# Patient Record
Sex: Female | Born: 1955 | Race: Black or African American | Hispanic: No | Marital: Single | Smoking: Current every day smoker
Health system: Southern US, Community
[De-identification: ages and names within clinical notes are randomized; demographics above are authoritative.]

## PROBLEM LIST (undated history)

## (undated) DIAGNOSIS — F419 Anxiety disorder, unspecified: Secondary | ICD-10-CM

## (undated) DIAGNOSIS — F32A Depression, unspecified: Secondary | ICD-10-CM

## (undated) DIAGNOSIS — B20 Human immunodeficiency virus [HIV] disease: Secondary | ICD-10-CM

## (undated) DIAGNOSIS — K8020 Calculus of gallbladder without cholecystitis without obstruction: Secondary | ICD-10-CM

## (undated) DIAGNOSIS — M5430 Sciatica, unspecified side: Secondary | ICD-10-CM

## (undated) DIAGNOSIS — F329 Major depressive disorder, single episode, unspecified: Secondary | ICD-10-CM

## (undated) DIAGNOSIS — F17200 Nicotine dependence, unspecified, uncomplicated: Secondary | ICD-10-CM

## (undated) DIAGNOSIS — Z5189 Encounter for other specified aftercare: Secondary | ICD-10-CM

## (undated) DIAGNOSIS — J45909 Unspecified asthma, uncomplicated: Secondary | ICD-10-CM

## (undated) DIAGNOSIS — Z21 Asymptomatic human immunodeficiency virus [HIV] infection status: Secondary | ICD-10-CM

## (undated) HISTORY — DX: Calculus of gallbladder without cholecystitis without obstruction: K80.20

## (undated) HISTORY — DX: Major depressive disorder, single episode, unspecified: F32.9

## (undated) HISTORY — DX: Nicotine dependence, unspecified, uncomplicated: F17.200

## (undated) HISTORY — PX: OTHER SURGICAL HISTORY: SHX169

## (undated) HISTORY — DX: Anxiety disorder, unspecified: F41.9

## (undated) HISTORY — DX: Encounter for other specified aftercare: Z51.89

## (undated) HISTORY — DX: Depression, unspecified: F32.A

---
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
## 2013-02-27 ENCOUNTER — Emergency Department (HOSPITAL_COMMUNITY)
Admission: EM | Admit: 2013-02-27 | Discharge: 2013-02-27 | Disposition: A | Discharge status: Home / Self Care | Payer: Medicaid Other | Attending: Emergency Medicine | Admitting: Emergency Medicine

## 2013-02-27 ENCOUNTER — Encounter (HOSPITAL_COMMUNITY): Payer: Self-pay | Admitting: *Deleted

## 2013-02-27 ENCOUNTER — Emergency Department (HOSPITAL_COMMUNITY): Payer: Medicaid Other

## 2013-02-27 DIAGNOSIS — M549 Dorsalgia, unspecified: Secondary | ICD-10-CM | POA: Insufficient documentation

## 2013-02-27 DIAGNOSIS — R509 Fever, unspecified: Secondary | ICD-10-CM | POA: Insufficient documentation

## 2013-02-27 DIAGNOSIS — J45909 Unspecified asthma, uncomplicated: Secondary | ICD-10-CM | POA: Insufficient documentation

## 2013-02-27 DIAGNOSIS — Z21 Asymptomatic human immunodeficiency virus [HIV] infection status: Secondary | ICD-10-CM | POA: Insufficient documentation

## 2013-02-27 DIAGNOSIS — Z79899 Other long term (current) drug therapy: Secondary | ICD-10-CM | POA: Insufficient documentation

## 2013-02-27 DIAGNOSIS — F17200 Nicotine dependence, unspecified, uncomplicated: Secondary | ICD-10-CM | POA: Insufficient documentation

## 2013-02-27 DIAGNOSIS — Z880 Allergy status to penicillin: Secondary | ICD-10-CM | POA: Insufficient documentation

## 2013-02-27 DIAGNOSIS — Z3202 Encounter for pregnancy test, result negative: Secondary | ICD-10-CM | POA: Insufficient documentation

## 2013-02-27 DIAGNOSIS — K8020 Calculus of gallbladder without cholecystitis without obstruction: Secondary | ICD-10-CM | POA: Insufficient documentation

## 2013-02-27 DIAGNOSIS — Z8669 Personal history of other diseases of the nervous system and sense organs: Secondary | ICD-10-CM | POA: Insufficient documentation

## 2013-02-27 DIAGNOSIS — R112 Nausea with vomiting, unspecified: Secondary | ICD-10-CM | POA: Insufficient documentation

## 2013-02-27 DIAGNOSIS — R5381 Other malaise: Secondary | ICD-10-CM | POA: Insufficient documentation

## 2013-02-27 HISTORY — DX: Unspecified asthma, uncomplicated: J45.909

## 2013-02-27 HISTORY — DX: Sciatica, unspecified side: M54.30

## 2013-02-27 HISTORY — DX: Asymptomatic human immunodeficiency virus (hiv) infection status: Z21

## 2013-02-27 HISTORY — DX: Human immunodeficiency virus (HIV) disease: B20

## 2013-02-27 LAB — URINALYSIS, ROUTINE W REFLEX MICROSCOPIC
Bilirubin Urine: NEGATIVE
Hgb urine dipstick: NEGATIVE
Nitrite: NEGATIVE
Specific Gravity, Urine: 1.015 (ref 1.005–1.030)
Urobilinogen, UA: 0.2 mg/dL (ref 0.0–1.0)
pH: 6 (ref 5.0–8.0)

## 2013-02-27 LAB — CBC WITH DIFFERENTIAL/PLATELET
Basophils Absolute: 0 10*3/uL (ref 0.0–0.1)
Basophils Relative: 1 % (ref 0–1)
Eosinophils Absolute: 0 10*3/uL (ref 0.0–0.7)
Eosinophils Relative: 0 % (ref 0–5)
Lymphs Abs: 2.1 10*3/uL (ref 0.7–4.0)
MCH: 29.9 pg (ref 26.0–34.0)
MCHC: 33.1 g/dL (ref 30.0–36.0)
MCV: 90.1 fL (ref 78.0–100.0)
Neutrophils Relative %: 59 % (ref 43–77)
Platelets: 200 10*3/uL (ref 150–400)
RBC: 3.85 MIL/uL — ABNORMAL LOW (ref 3.87–5.11)
RDW: 14.3 % (ref 11.5–15.5)

## 2013-02-27 LAB — COMPREHENSIVE METABOLIC PANEL
ALT: 17 U/L (ref 0–35)
Albumin: 3.5 g/dL (ref 3.5–5.2)
Alkaline Phosphatase: 78 U/L (ref 39–117)
Calcium: 9.2 mg/dL (ref 8.4–10.5)
GFR calc Af Amer: >90 mL/min (ref >90)
Potassium: 3.4 mEq/L — ABNORMAL LOW (ref 3.5–5.1)
Sodium: 139 mEq/L (ref 135–145)
Total Protein: 7.6 g/dL (ref 6.0–8.3)

## 2013-02-27 LAB — PREGNANCY, URINE: Preg Test, Ur: NEGATIVE

## 2013-02-27 MED ORDER — ONDANSETRON HCL 4 MG/2ML IJ SOLN
4.0000 mg | Freq: Once | INTRAMUSCULAR | Status: AC
  Administered 2013-02-27: 4 mg via INTRAVENOUS
  Filled 2013-02-27: qty 2

## 2013-02-27 MED ORDER — ONDANSETRON 8 MG PO TBDP: ORAL_TABLET | ORAL | Status: DC

## 2013-02-27 MED ORDER — PANTOPRAZOLE SODIUM 20 MG PO TBEC: 40.0000 mg | DELAYED_RELEASE_TABLET | Freq: Every day | ORAL | Status: DC

## 2013-02-27 MED ORDER — MORPHINE SULFATE 4 MG/ML IJ SOLN
4.0000 mg | Freq: Once | INTRAMUSCULAR | Status: AC
  Administered 2013-02-27: 4 mg via INTRAVENOUS
  Filled 2013-02-27: qty 1

## 2013-02-27 MED ORDER — PANTOPRAZOLE SODIUM 40 MG IV SOLR
40.0000 mg | Freq: Once | INTRAVENOUS | Status: AC
  Administered 2013-02-27: 40 mg via INTRAVENOUS
  Filled 2013-02-27: qty 40

## 2013-02-27 MED ORDER — OXYCODONE-ACETAMINOPHEN 5-325 MG PO TABS: 2.0000 | ORAL_TABLET | ORAL | Status: DC | PRN

## 2013-02-27 NOTE — ED Notes (Signed)
Left AC PIV removed prior to d/c

## 2013-02-27 NOTE — ED Provider Notes (Signed)
CSN: [id number]     Arrival date & time 02/27/13  1320 History     First MD Initiated Contact with Patient 02/27/13 1356     Chief Complaint  Patient presents with  . Back Pain  . Abdominal Pain   (Consider location/radiation/quality/duration/timing/severity/associated sxs/prior Treatment) HPI Comments: Patient presents with abdominal pain. She states that she can [place] food on Saturday and then yesterday started having some pain across her upper abdomen. It goes around through to her back in the left side of her back. She says is a burning pain. She's had some nausea and vomiting associated with it. She denies any diarrhea or constipation. She said that she had a fever yesterday. She denies any urinary symptoms. She had a history of similar pain in the past that sounds like it was diagnosed as gastritis. She denies any gallbladder problems or history of abdominal surgeries.  Patient is a 57 y.o. female presenting with back pain and abdominal pain.  Back Pain Associated symptoms: abdominal pain and fever   Associated symptoms: no chest pain, no headaches, no numbness and no weakness   Abdominal Pain Associated symptoms include abdominal pain. Pertinent negatives include no chest pain, no headaches and no shortness of breath.    Past Medical History  Diagnosis Date  . Asthma   . HIV (human immunodeficiency virus infection)   . Sciatic nerve pain    History reviewed. No pertinent past surgical history. No family history on file. History  Substance Use Topics  . Smoking status: Current Every Day Smoker    Types: Cigarettes  . Smokeless tobacco: Never Used  . Alcohol Use: Yes   OB History   Grav Para Term Preterm Abortions TAB SAB Ect Mult Living                 Review of Systems  Constitutional: Positive for fever and fatigue. Negative for chills and diaphoresis.  HENT: Negative for congestion, rhinorrhea and sneezing.   Eyes: Negative.   Respiratory: Negative for cough,  chest tightness and shortness of breath.   Cardiovascular: Negative for chest pain and leg swelling.  Gastrointestinal: Positive for nausea, vomiting and abdominal pain. Negative for diarrhea and blood in stool.  Genitourinary: Negative for frequency, hematuria, flank pain and difficulty urinating.  Musculoskeletal: Positive for back pain. Negative for arthralgias.  Skin: Negative for rash.  Neurological: Negative for dizziness, speech difficulty, weakness, numbness and headaches.    Allergies  Penicillins  Home Medications   Current Outpatient Rx  Name  Route  Sig  Dispense  Refill  . aspirin 81 MG tablet   Oral   Take 81 mg by mouth daily.         Marland Kitchen OVER THE COUNTER MEDICATION   Oral   Take 1 capsule by mouth daily. morenga herbal supplement for immune system and energy         . ondansetron (ZOFRAN ODT) 8 MG disintegrating tablet      8mg  ODT q4 hours prn nausea   4 tablet   0   . oxyCODONE-acetaminophen (PERCOCET) 5-325 MG per tablet   Oral   Take 2 tablets by mouth every 4 (four) hours as needed for pain.   20 tablet   0   . pantoprazole (PROTONIX) 20 MG tablet   Oral   Take 2 tablets (40 mg total) by mouth daily.   30 tablet   0    BP 118/79  Pulse 73  Temp(Src) 98 F (36.7 C) (  Oral)  Resp 20  SpO2 100% Physical Exam  Constitutional: She is oriented to person, place, and time. She appears well-developed and well-nourished.  HENT:  Head: Normocephalic and atraumatic.  Eyes: Pupils are equal, round, and reactive to light.  Neck: Normal range of motion. Neck supple.  Cardiovascular: Normal rate, regular rhythm and normal heart sounds.   Pulmonary/Chest: Effort normal and breath sounds normal. No respiratory distress. She has no wheezes. She has no rales. She exhibits no tenderness.  Abdominal: Soft. Bowel sounds are normal. There is tenderness (Positive tenderness to the epigastric and right upper quadrant area. There's no CVA tenderness.). There is no  rebound and no guarding.  Musculoskeletal: Normal range of motion. She exhibits no edema.  Lymphadenopathy:    She has no cervical adenopathy.  Neurological: She is alert and oriented to person, place, and time.  Skin: Skin is warm and dry. No rash noted.  Psychiatric: She has a normal mood and affect.    ED Course   Procedures (including critical care time)  Results for orders placed during the hospital encounter of 02/27/13  CBC WITH DIFFERENTIAL      Result Value Range   WBC 5.7  4.0 - 10.5 K/uL   RBC 3.85 (*) 3.87 - 5.11 MIL/uL   Hemoglobin 11.5 (*) 12.0 - 15.0 g/dL   HCT 40.9 (*) 81.1 - 91.4 %   MCV 90.1  78.0 - 100.0 fL   MCH 29.9  26.0 - 34.0 pg   MCHC 33.1  30.0 - 36.0 g/dL   RDW 78.2  95.6 - 21.3 %   Platelets 200  150 - 400 K/uL   Neutrophils Relative % 59  43 - 77 %   Neutro Abs 3.4  1.7 - 7.7 K/uL   Lymphocytes Relative 36  12 - 46 %   Lymphs Abs 2.1  0.7 - 4.0 K/uL   Monocytes Relative 4  3 - 12 %   Monocytes Absolute 0.3  0.1 - 1.0 K/uL   Eosinophils Relative 0  0 - 5 %   Eosinophils Absolute 0.0  0.0 - 0.7 K/uL   Basophils Relative 1  0 - 1 %   Basophils Absolute 0.0  0.0 - 0.1 K/uL  COMPREHENSIVE METABOLIC PANEL      Result Value Range   Sodium 139  135 - 145 mEq/L   Potassium 3.4 (*) 3.5 - 5.1 mEq/L   Chloride 103  96 - 112 mEq/L   CO2 26  19 - 32 mEq/L   Glucose, Bld 129 (*) 70 - 99 mg/dL   BUN 11  6 - 23 mg/dL   Creatinine, Ser 0.86  0.50 - 1.10 mg/dL   Calcium 9.2  8.4 - 57.8 mg/dL   Total Protein 7.6  6.0 - 8.3 g/dL   Albumin 3.5  3.5 - 5.2 g/dL   AST 25  0 - 37 U/L   ALT 17  0 - 35 U/L   Alkaline Phosphatase 78  39 - 117 U/L   Total Bilirubin 0.2 (*) 0.3 - 1.2 mg/dL   GFR calc non Af Amer >90  >90 mL/min   GFR calc Af Amer >90  >90 mL/min  LIPASE, BLOOD      Result Value Range   Lipase 23  11 - 59 U/L   US Abdomen Complete  02/27/2013   *RADIOLOGY REPORT*  Clinical Data:  *RADIOLOGY REPORT*  Clinical Data:  Right upper quadrant pain.   COMPLETE ABDOMINAL ULTRASOUND  Comparison:  None.  Findings:  Gallbladder:  1.3 cm gallstone within the gallbladder.  Small amount of sludge.  No wall thickening.  Negative sonographic Murphy's.  Common bile duct:   Mildly prominent measuring up to 7 mm.  No visible stones.  Liver:  No focal lesion identified.  Within normal limits in parenchymal echogenicity.  IVC:  Appears normal.  Pancreas:  No focal abnormality seen.  Spleen:  Within normal limits in size and echotexture.  Right Kidney:   Normal in size and parenchymal echogenicity.  No evidence of mass or hydronephrosis.  Left Kidney:  Normal in size and parenchymal echogenicity.  No evidence of mass or hydronephrosis.  Abdominal aorta:  No aneurysm identified.  IMPRESSION: Cholelithiasis.  Common bile duct borderline in diameter.  No visible stones.  Recommend correlation to LFTs.   Original Report Authenticated By: Charlett Nose, M.D.     1. Cholelithiasis     MDM  Patient evidence of cholelithiasis but no evidence of cholecystitis. Her liver enzymes are normal. She is currently well pain control. There is no evidence of pancreatitis. She was discharged home with outpatient referral to surgery. She is given a prescription for Percocet and Zofran as well as Protonix.  I am awaiting urine results and pt to be discharged following urine.  Will have Dr. Bernette Mayers f/u on urine.  Rolan Bucco, MD 02/27/13 1524

## 2013-02-27 NOTE — ED Notes (Signed)
Pt states she ate chinese food Friday, then Saturday started having abdominal pain, back pain, nausea, vomited x 2, burning under the skin.

## 2013-04-19 ENCOUNTER — Encounter: Payer: Self-pay | Admitting: Gastroenterology

## 2013-05-03 ENCOUNTER — Ambulatory Visit (INDEPENDENT_AMBULATORY_CARE_PROVIDER_SITE_OTHER): Payer: Medicaid Other

## 2013-05-03 ENCOUNTER — Other Ambulatory Visit (HOSPITAL_COMMUNITY)
Admission: RE | Admit: 2013-05-03 | Discharge: 2013-05-03 | Disposition: A | Discharge status: Home / Self Care | Payer: Medicaid Other | Source: Ambulatory Visit | Attending: Internal Medicine | Admitting: Internal Medicine

## 2013-05-03 DIAGNOSIS — Z21 Asymptomatic human immunodeficiency virus [HIV] infection status: Secondary | ICD-10-CM

## 2013-05-03 DIAGNOSIS — B20 Human immunodeficiency virus [HIV] disease: Secondary | ICD-10-CM

## 2013-05-03 DIAGNOSIS — Z113 Encounter for screening for infections with a predominantly sexual mode of transmission: Secondary | ICD-10-CM | POA: Insufficient documentation

## 2013-05-03 LAB — CBC WITH DIFFERENTIAL/PLATELET
Basophils Absolute: 0 10*3/uL (ref 0.0–0.1)
HCT: 36.6 % (ref 36.0–46.0)
Lymphocytes Relative: 61 % — ABNORMAL HIGH (ref 12–46)
Lymphs Abs: 2.6 10*3/uL (ref 0.7–4.0)
MCV: 89.3 fL (ref 78.0–100.0)
Neutro Abs: 1.4 10*3/uL — ABNORMAL LOW (ref 1.7–7.7)
Platelets: 205 10*3/uL (ref 150–400)
RBC: 4.1 MIL/uL (ref 3.87–5.11)
RDW: 15.8 % — ABNORMAL HIGH (ref 11.5–15.5)
WBC: 4.3 10*3/uL (ref 4.0–10.5)

## 2013-05-03 LAB — COMPLETE METABOLIC PANEL WITH GFR
ALT: 16 U/L (ref 0–35)
AST: 24 U/L (ref 0–37)
Albumin: 4.1 g/dL (ref 3.5–5.2)
CO2: 28 mEq/L (ref 19–32)
Calcium: 9.4 mg/dL (ref 8.4–10.5)
Chloride: 105 mEq/L (ref 96–112)
GFR, Est African American: >89 mL/min
Potassium: 4.2 mEq/L (ref 3.5–5.3)
Total Protein: 7.7 g/dL (ref 6.0–8.3)

## 2013-05-03 LAB — LIPID PANEL
Cholesterol: 140 mg/dL (ref 0–200)
LDL Cholesterol: 73 mg/dL (ref 0–99)
Triglycerides: 89 mg/dL (ref <150)

## 2013-05-03 LAB — HEPATITIS C ANTIBODY: HCV Ab: NEGATIVE

## 2013-05-04 LAB — URINALYSIS
Ketones, ur: NEGATIVE mg/dL
Leukocytes, UA: NEGATIVE
Nitrite: NEGATIVE
Specific Gravity, Urine: 1.018 (ref 1.005–1.030)
Urobilinogen, UA: 1 mg/dL (ref 0.0–1.0)

## 2013-05-04 LAB — T-HELPER CELL (CD4) - (RCID CLINIC ONLY)
CD4 % Helper T Cell: 18 % — ABNORMAL LOW (ref 33–55)
CD4 T Cell Abs: 460 /uL (ref 400–2700)

## 2013-05-04 NOTE — Progress Notes (Signed)
Patient referred by Primary Care Physician Dr Cloyd Stagers- Bonsu. She was diagnosed in 2006. She does not recall her last CD-4 or Viral Load and does not currently take ART.  Patient states she was never on medication and take lots of herbal medications.  She is vegetarian.  She has not seen an ID physician in many years and would just like to find out what her counts are currently and has not interest in starting medications.  No medical records requested since patient is not sure of last visit address or name.  She has refused the pneumonia and the flu vaccine but did consent to the TB screening.    Laurell Josephs, RN

## 2013-05-05 LAB — HIV-1 RNA ULTRAQUANT REFLEX TO GENTYP+
HIV 1 RNA Quant: 112 copies/mL — ABNORMAL HIGH (ref <20)
HIV-1 RNA Quant, Log: 2.05 {Log} — ABNORMAL HIGH (ref <1.30)

## 2013-05-05 LAB — HEPATITIS A ANTIBODY, TOTAL: Hep A Total Ab: POSITIVE — AB

## 2013-05-19 ENCOUNTER — Ambulatory Visit (INDEPENDENT_AMBULATORY_CARE_PROVIDER_SITE_OTHER): Payer: Medicaid Other | Admitting: Gastroenterology

## 2013-05-19 ENCOUNTER — Encounter: Payer: Self-pay | Admitting: Gastroenterology

## 2013-05-19 VITALS — BP 110/80 | HR 72 | Ht 65.0 in | Wt 172.4 lb

## 2013-05-19 DIAGNOSIS — Z1211 Encounter for screening for malignant neoplasm of colon: Secondary | ICD-10-CM

## 2013-05-19 DIAGNOSIS — K219 Gastro-esophageal reflux disease without esophagitis: Secondary | ICD-10-CM

## 2013-05-19 DIAGNOSIS — Z21 Asymptomatic human immunodeficiency virus [HIV] infection status: Secondary | ICD-10-CM

## 2013-05-19 DIAGNOSIS — K8020 Calculus of gallbladder without cholecystitis without obstruction: Secondary | ICD-10-CM | POA: Insufficient documentation

## 2013-05-19 MED ORDER — NA SULFATE-K SULFATE-MG SULF 17.5-3.13-1.6 GM/177ML PO SOLN: 1.0000 | Freq: Once | ORAL | Status: DC

## 2013-05-19 MED ORDER — PANTOPRAZOLE SODIUM 40 MG PO TBEC: 40.0000 mg | DELAYED_RELEASE_TABLET | Freq: Every day | ORAL | Status: DC

## 2013-05-19 NOTE — Assessment & Plan Note (Signed)
The patient has symptomatic reflux characterized by pyrosis, regurgitation and nausea.  Recommendations #1 Protonix 40 mg daily

## 2013-05-19 NOTE — Progress Notes (Signed)
History of Present Illness: 57 year old HIV-positive Afro-American female referred for evaluation of gallbladder stones.  She complains of frequent burning chest discomfort and occasional postprandial nausea.  She denies hoarseness, coughing or dysphagia.  She also denies abdominal pain.  Products cause cramping and diarrhea.  She denies rectal bleeding.  Abdominal ultrasound demonstrated gallbladder stones.    Past Medical History  Diagnosis Date  . Asthma   . HIV (human immunodeficiency virus infection)   . Sciatic nerve pain   . Depression   . Blood transfusion without reported diagnosis 1975  . Tobacco dependence   . Anxiety   . Cholelithiasis    Past Surgical History  Procedure Laterality Date  . Ethopic pregnancy      1979   family history includes Alzheimer's disease in her father; Glaucoma in her mother; Hypertension in her father. Current Outpatient Prescriptions  Medication Sig Dispense Refill  . aspirin 81 MG tablet Take 81 mg by mouth daily. Three times a week      . OVER THE COUNTER MEDICATION Take 1 capsule by mouth daily. morenga herbal supplement for immune system and energy      . oxyCODONE-acetaminophen (PERCOCET) 5-325 MG per tablet Take 2 tablets by mouth every 4 (four) hours as needed for pain.  20 tablet  0  . pantoprazole (PROTONIX) 20 MG tablet Take 2 tablets (40 mg total) by mouth daily.  30 tablet  0   No current facility-administered medications for this visit.   Allergies as of 05/19/2013 - Review Complete 05/19/2013  Allergen Reaction Noted  . Penicillins Hives and Rash 02/27/2013    reports that she has been smoking Cigarettes.  She has been smoking about 0.40 packs per day. She has never used smokeless tobacco. She reports that she uses illicit drugs (Marijuana) about 4 times per week. She reports that she does not drink alcohol.     Review of Systems: Pertinent positive and negative review of systems were noted in the above HPI section. All other  review of systems were otherwise negative.  Vital signs were reviewed in today's medical record Physical Exam: General: Well developed , well nourished, no acute distress Skin: anicteric Head: Normocephalic and atraumatic Eyes:  sclerae anicteric, EOMI Ears: Normal auditory acuity Mouth: No deformity or lesions Neck: Supple, no masses or thyromegaly Lungs: Clear throughout to auscultation Heart: Regular rate and rhythm; no murmurs, rubs or bruits Abdomen: Soft, non tender and non distended. No masses, hepatosplenomegaly or hernias noted. Normal Bowel sounds Rectal:deferred Musculoskeletal: Symmetrical with no gross deformities  Skin: No lesions on visible extremities Pulses:  Normal pulses noted Extremities: No clubbing, cyanosis, edema or deformities noted Neurological: Alert oriented x 4, grossly nonfocal Cervical Nodes:  No significant cervical adenopathy Inguinal Nodes: No significant inguinal adenopathy Psychological:  Alert and cooperative. Normal mood and affect

## 2013-05-19 NOTE — Assessment & Plan Note (Signed)
Plan screening colonoscopy 

## 2013-05-19 NOTE — Patient Instructions (Signed)

## 2013-05-19 NOTE — Assessment & Plan Note (Signed)
Patient has asymptomatic cholelithiasis.  Plan no further therapy.

## 2013-05-23 ENCOUNTER — Ambulatory Visit (INDEPENDENT_AMBULATORY_CARE_PROVIDER_SITE_OTHER): Payer: Medicaid Other | Admitting: Internal Medicine

## 2013-05-23 ENCOUNTER — Encounter: Payer: Self-pay | Admitting: Internal Medicine

## 2013-05-23 VITALS — BP 129/81 | HR 73 | Temp 98.1°F | Ht 65.0 in | Wt 171.0 lb

## 2013-05-23 DIAGNOSIS — B20 Human immunodeficiency virus [HIV] disease: Secondary | ICD-10-CM

## 2013-05-23 DIAGNOSIS — Z23 Encounter for immunization: Secondary | ICD-10-CM

## 2013-05-23 MED ORDER — ELVITEG-COBIC-EMTRICIT-TENOFDF 150-150-200-300 MG PO TABS: 1.0000 | ORAL_TABLET | Freq: Every day | ORAL | Status: DC

## 2013-05-23 NOTE — Addendum Note (Signed)
Addended by: Wendall Mola A on: 05/23/2013 10:18 AM   Modules accepted: Orders

## 2013-05-23 NOTE — Progress Notes (Signed)
[facility] NOTE  RFV: re-establishing in care Subjective:    Patient ID: Kim Butler, female    DOB: 1956-02-24, 58 y.o.   MRN: [id number]  HPI 57yo j[place] female with HIV, CD 4 count 460/VL 112, diagnosed in 2006. She originally saw someone, Dr. Selena Batten, in [place] but has not started on meds, or continued in care. She has moved in [place] in Jan 2014. ART naive. (previously at [facility]). RF-> sex. Doing well overall. No complaints.  Allergies  Allergen Reactions  . Penicillins Hives and Rash    Current Outpatient Prescriptions on File Prior to Visit  Medication Sig Dispense Refill  . aspirin 81 MG tablet Take 81 mg by mouth daily. Three times a week      . OVER THE COUNTER MEDICATION Take 1 capsule by mouth daily. morenga herbal supplement for immune system and energy      . pantoprazole (PROTONIX) 40 MG tablet Take 1 tablet (40 mg total) by mouth daily.  30 tablet  3  . Na Sulfate-K Sulfate-Mg Sulf (SUPREP BOWEL PREP) SOLN Take 1 kit by mouth once.  1 Bottle  0   No current facility-administered medications on file prior to visit.   Active Ambulatory Problems    Diagnosis Date Noted  . Esophageal reflux 05/19/2013  . CL (cholelithiasis) 05/19/2013  . Special screening for malignant neoplasms, colon 05/19/2013  . HIV positive 05/19/2013   Resolved Ambulatory Problems    Diagnosis Date Noted  . No Resolved Ambulatory Problems   Past Medical History  Diagnosis Date  . Asthma   . HIV (human immunodeficiency virus infection)   . Sciatic nerve pain   . Depression   . Blood transfusion without reported diagnosis 1975  . Tobacco dependence   . Anxiety   . Cholelithiasis     Soc hx: smoke 3-4 cig/day, smoke MJ. No alcohol use. No ivdu. She has 4 children, inc. An adopted son. 1 [place], 2 [place], 1 [place], "1 son is  [place]". Has mother in [place]. No family in the area.  Family hx: brother with DM, daughter with type 2 DM.  Review of Systems  Constitutional: Negative for  fever, chills, diaphoresis, activity change, appetite change, fatigue and unexpected weight change.  HENT: Negative for congestion, sore throat, rhinorrhea, sneezing, trouble swallowing and sinus pressure.  Eyes: Negative for photophobia and visual disturbance.  Respiratory: Negative for cough, chest tightness, shortness of breath, wheezing and stridor.  Cardiovascular: Negative for chest pain, palpitations and leg swelling.  Gastrointestinal: Negative for nausea, vomiting, abdominal pain, diarrhea, constipation, blood in stool, abdominal distention and anal bleeding.  Genitourinary: Negative for dysuria, hematuria, flank pain and difficulty urinating.  Musculoskeletal: Negative for myalgias, back pain, joint swelling, arthralgias and gait problem.  Skin: Negative for color change, pallor, rash and wound.  Neurological: Negative for dizziness, tremors, weakness and light-headedness.  Hematological: Negative for adenopathy. Does not bruise/bleed easily.  Psychiatric/Behavioral: Negative for behavioral problems, confusion, sleep disturbance, dysphoric mood, decreased concentration and agitation.       Objective:   Physical Exam BP 129/81  Pulse 73  Temp(Src) 98.1 F (36.7 C) (Oral)  Ht 5\' 5"  (1.651 m)  Wt 171 lb (77.565 kg)  BMI 28.46 kg/m2 Physical Exam  Constitutional:  oriented to person, place, and time. appears well-developed and well-nourished. No distress.  HENT:  Mouth/Throat: Oropharynx is clear and moist. No oropharyngeal exudate.  Cardiovascular: Normal rate, regular rhythm and normal heart sounds. Exam reveals no gallop and no  friction rub.  No murmur heard.  Pulmonary/Chest: Effort normal and breath sounds normal. No respiratory distress.  no wheezes.  Abdominal: Soft. Bowel sounds are normal. exhibits no distension. There is no tenderness.  Lymphadenopathy:  no cervical adenopathy.  Neurological: alert and oriented to person, place, and time.  Skin: Skin is warm and dry.  No rash noted. No erythema.  Psychiatric:  a normal mood and affect. His behavior is normal.      Assessment & Plan:  HIV = getting started on stribild. Reviewing for adherence. Has medicaid  Health maintenance =will get pneumococcal vaccine but deferred having flu vax  rtc in 4 wks  45 min with greater than 50% spent on counseling

## 2013-05-24 ENCOUNTER — Telehealth: Payer: Self-pay | Admitting: *Deleted

## 2013-05-24 NOTE — Telephone Encounter (Signed)
Referring provider, Dr. Julio Sicks, requesting office note from her visit.  Faxed to [phone] Andree Coss, RN

## 2013-06-16 ENCOUNTER — Encounter: Payer: Medicaid Other | Admitting: Gastroenterology

## 2013-06-21 ENCOUNTER — Ambulatory Visit: Payer: Medicaid Other | Admitting: Internal Medicine

## 2013-11-01 ENCOUNTER — Encounter: Payer: Self-pay | Admitting: *Deleted

## 2014-05-03 ENCOUNTER — Telehealth: Payer: Self-pay | Admitting: *Deleted

## 2014-05-03 NOTE — Telephone Encounter (Signed)
Last OV 05/23/13 - pt needs MD appt ASAP.  Phone message left to call office for appt.  Number given.  Phone call to pt pharmacy, [facility] on [address].  Pt last filled Stribild rx on 05/23/13.  No further refills.  [facility] referral for pt "out-of-care."

## 2014-05-10 ENCOUNTER — Other Ambulatory Visit: Payer: Medicaid Other

## 2014-05-11 ENCOUNTER — Encounter (HOSPITAL_COMMUNITY): Payer: Self-pay | Admitting: Emergency Medicine

## 2014-05-11 ENCOUNTER — Emergency Department (HOSPITAL_COMMUNITY): Payer: Medicaid Other

## 2014-05-11 ENCOUNTER — Emergency Department (HOSPITAL_COMMUNITY)
Admission: EM | Admit: 2014-05-11 | Discharge: 2014-05-11 | Disposition: A | Discharge status: Home / Self Care | Payer: Medicaid Other | Attending: Emergency Medicine | Admitting: Emergency Medicine

## 2014-05-11 ENCOUNTER — Other Ambulatory Visit: Payer: Self-pay

## 2014-05-11 DIAGNOSIS — Z79899 Other long term (current) drug therapy: Secondary | ICD-10-CM | POA: Diagnosis not present

## 2014-05-11 DIAGNOSIS — M549 Dorsalgia, unspecified: Secondary | ICD-10-CM | POA: Diagnosis present

## 2014-05-11 DIAGNOSIS — Z720 Tobacco use: Secondary | ICD-10-CM | POA: Diagnosis not present

## 2014-05-11 DIAGNOSIS — Z7982 Long term (current) use of aspirin: Secondary | ICD-10-CM | POA: Insufficient documentation

## 2014-05-11 DIAGNOSIS — Z880 Allergy status to penicillin: Secondary | ICD-10-CM | POA: Diagnosis not present

## 2014-05-11 DIAGNOSIS — M26609 Unspecified temporomandibular joint disorder, unspecified side: Secondary | ICD-10-CM

## 2014-05-11 DIAGNOSIS — Z8719 Personal history of other diseases of the digestive system: Secondary | ICD-10-CM | POA: Diagnosis not present

## 2014-05-11 DIAGNOSIS — M5442 Lumbago with sciatica, left side: Secondary | ICD-10-CM | POA: Insufficient documentation

## 2014-05-11 DIAGNOSIS — Z21 Asymptomatic human immunodeficiency virus [HIV] infection status: Secondary | ICD-10-CM | POA: Insufficient documentation

## 2014-05-11 DIAGNOSIS — J45909 Unspecified asthma, uncomplicated: Secondary | ICD-10-CM | POA: Insufficient documentation

## 2014-05-11 DIAGNOSIS — Z8659 Personal history of other mental and behavioral disorders: Secondary | ICD-10-CM | POA: Insufficient documentation

## 2014-05-11 DIAGNOSIS — M2662 Arthralgia of temporomandibular joint: Secondary | ICD-10-CM | POA: Diagnosis not present

## 2014-05-11 DIAGNOSIS — R6884 Jaw pain: Secondary | ICD-10-CM

## 2014-05-11 MED ORDER — CYCLOBENZAPRINE HCL 10 MG PO TABS: 10.0000 mg | ORAL_TABLET | Freq: Two times a day (BID) | ORAL | Status: DC | PRN

## 2014-05-11 NOTE — ED Provider Notes (Signed)
Medical screening examination/treatment/procedure(s) were performed by non-physician practitioner and as supervising physician I was immediately available for consultation/collaboration.     Date: 05/11/2014 11:01 AM  Rate: 71  Rhythm: normal sinus rhythm  QRS Axis: normal  Intervals: normal  ST/T Wave abnormalities: normal  Conduction Disutrbances: none  Narrative Interpretation: unremarkable; Q waves in V1 and V2; no old for comparison        Layla MawKristen N Tahirih Lair, DO 05/11/14 1436

## 2014-05-11 NOTE — ED Notes (Signed)
Pt reports yesterday was eating [employer] and felt a lump to the left side of face, then felt tightness to the left side of her jaw while she was eating. For 4 days, has had left shoulder blade pain that is worse when bending. [place] bus. Has hx of HIV. Is a x 4. In NAD

## 2014-05-11 NOTE — ED Provider Notes (Signed)
CSN: [id number]     Arrival date & time 05/11/14  1048 History   First MD Initiated Contact with Patient 05/11/14 1115     Chief Complaint  Patient presents with  . Jaw Pain  . Back Pain     (Consider location/radiation/quality/duration/timing/severity/associated sxs/prior Treatment) HPI Comments: Patient with HIV, not taking antivirals, presents to the emergency department with chief complaint of jaw pain and back pain.  Jaw pain: Pain started on the left side of the patient's jaw yesterday while chewing.  She has not had this pain before.  She reports associated swelling.  She denies fevers.  She has not taken anything to alleviate the symptoms.  She denies any clicking, popping, or locking.  Back pain: Patient states that the back pain started 2 weeks ago.  She has a history of sciatica. She states this felt similar. She states that it was aggravated after a long car drive. She states it radiates from the center of her back to her left ribs. She denies any associated shortness of breath or chest pain. She reports having a cough. She has not taken anything to alleviate the symptoms.  The history is provided by the patient. No language interpreter was used.    Past Medical History  Diagnosis Date  . Asthma   . HIV (human immunodeficiency virus infection)   . Sciatic nerve pain   . Depression   . Blood transfusion without reported diagnosis 1975  . Tobacco dependence   . Anxiety   . Cholelithiasis    Past Surgical History  Procedure Laterality Date  . Ethopic pregnancy      1979   Family History  Problem Relation Age of Onset  . Glaucoma Mother   . Alzheimer's disease Father   . Hypertension Father    History  Substance Use Topics  . Smoking status: Current Every Day Smoker -- 0.40 packs/day    Types: Cigarettes  . Smokeless tobacco: Never Used  . Alcohol Use: No   OB History   Grav Para Term Preterm Abortions TAB SAB Ect Mult Living   4 3   1   1  3      Review  of Systems  All other systems reviewed and are negative.     Allergies  Penicillins  Home Medications   Prior to Admission medications   Medication Sig Start Date End Date Taking? Authorizing Provider  aspirin 81 MG tablet Take 81 mg by mouth daily. Three times a week    Historical Provider, MD  elvitegravir-cobicistat-emtricitabine-tenofovir (STRIBILD) 150-150-200-300 MG TABS tablet Take 1 tablet by mouth daily with breakfast. 05/23/13   Carlyle Basques, MD  Na Sulfate-K Sulfate-Mg Sulf (SUPREP BOWEL PREP) SOLN Take 1 kit by mouth once. 05/19/13   Inda Castle, MD  OVER THE COUNTER MEDICATION Take 1 capsule by mouth daily. morenga herbal supplement for immune system and energy    Historical Provider, MD  pantoprazole (PROTONIX) 40 MG tablet Take 1 tablet (40 mg total) by mouth daily. 05/19/13   Inda Castle, MD   BP 136/82  Pulse 70  Temp(Src) 98.3 F (36.8 C) (Oral)  Resp 20  Ht 5' 5"  (1.651 m)  Wt 172 lb (78.019 kg)  BMI 28.62 kg/m2  SpO2 98% Physical Exam  Nursing note and vitals reviewed. Constitutional: She is oriented to person, place, and time. She appears well-developed and well-nourished.  HENT:  Head: Normocephalic and atraumatic.  Eyes: Conjunctivae and EOM are normal. Pupils are equal, round, and  reactive to light.  Neck: Normal range of motion. Neck supple.  Cardiovascular: Normal rate and regular rhythm.  Exam reveals no gallop and no friction rub.   No murmur heard. Pulmonary/Chest: Effort normal and breath sounds normal. No respiratory distress. She has no wheezes. She has no rales. She exhibits no tenderness.  Clear to auscultation bilaterally  Abdominal: Soft. She exhibits no distension and no mass. There is no tenderness. There is no rebound and no guarding.  No focal abdominal tenderness, no RLQ tenderness or pain at McBurney's point, no RUQ tenderness or Murphy's sign, no left-sided abdominal tenderness, no fluid wave, or signs of peritonitis    Musculoskeletal: Normal range of motion. She exhibits no edema and no tenderness.  Left thoracic back tenderness palpation, no bony abnormality or deformity, no step-offs, upper and lower extremity range of motion and strength 5/5  Neurological: She is alert and oriented to person, place, and time.  Skin: Skin is warm and dry.  Psychiatric: She has a normal mood and affect. Her behavior is normal. Judgment and thought content normal.    ED Course  Procedures (including critical care time) Labs Review Labs Reviewed - No data to display  Imaging Review Dg Chest 2 View  05/11/2014   CLINICAL DATA:  Chronic cough  EXAM: CHEST  2 VIEW  COMPARISON:  None.  FINDINGS: There is a 4 mm right upper lobe pulmonary nodule. There is no focal parenchymal opacity, pleural effusion, or pneumothorax. The heart and mediastinal contours are unremarkable.  The osseous structures are unremarkable.  IMPRESSION: No active cardiopulmonary disease.  There is a 4 mm right upper lobe pulmonary nodule. Recommend further evaluation with a CT of the chest.   Electronically Signed   By: Kathreen Devoid   On: 05/11/2014 12:06     EKG Interpretation None     ED ECG REPORT  Dr. Leonides Schanz, interpreted this EKG   Date: 05/11/2014   Rate: 71  Rhythm: normal sinus rhythm  QRS Axis: normal  Intervals: normal  ST/T Wave abnormalities: normal  Conduction Disutrbances:none  Narrative Interpretation: Q-waves in V1 and V2  Old EKG Reviewed: none available    MDM   Final diagnoses:  Jaw pain  TMJ (temporomandibular joint syndrome)  Left-sided low back pain with left-sided sciatica    Patient with 2 complaints. Jaw pain, suspect could be related to TMJ.  No chest pain or SOB.  No EKG changes. No evidence of abscess, or local infection. Back pain I suspect it is chronic, but with the patient's new cough, and HIV status, we'll check chest x-ray. No fevers or shortness of breath or chest pain.   Montine Circle,  PA-C 05/11/14 1309

## 2014-05-11 NOTE — Discharge Instructions (Signed)
Your chest x-ray today revealed a pulmonary nodule.  This needs to be investigated further with a CT scan of your chest in the next month.  Please notify your primary care provider of this finding.  Sciatica Sciatica is pain, weakness, numbness, or tingling along the path of the sciatic nerve. The nerve starts in the lower back and runs down the back of each leg. The nerve controls the muscles in the lower leg and in the back of the knee, while also providing sensation to the back of the thigh, lower leg, and the sole of your foot. Sciatica is a symptom of another medical condition. For instance, nerve damage or certain conditions, such as a herniated disk or bone spur on the spine, pinch or put pressure on the sciatic nerve. This causes the pain, weakness, or other sensations normally associated with sciatica. Generally, sciatica only affects one side of the body. CAUSES   Herniated or slipped disc.  Degenerative disk disease.  A pain disorder involving the narrow muscle in the buttocks (piriformis syndrome).  Pelvic injury or fracture.  Pregnancy.  Tumor (rare). SYMPTOMS  Symptoms can vary from mild to very severe. The symptoms usually travel from the low back to the buttocks and down the back of the leg. Symptoms can include:  Mild tingling or dull aches in the lower back, leg, or hip.  Numbness in the back of the calf or sole of the foot.  Burning sensations in the lower back, leg, or hip.  Sharp pains in the lower back, leg, or hip.  Leg weakness.  Severe back pain inhibiting movement. These symptoms may get worse with coughing, sneezing, laughing, or prolonged sitting or standing. Also, being overweight may worsen symptoms. DIAGNOSIS  Your caregiver will perform a physical exam to look for common symptoms of sciatica. He or she may ask you to do certain movements or activities that would trigger sciatic nerve pain. Other tests may be performed to find the cause of the sciatica.  These may include:  Blood tests.  X-rays.  Imaging tests, such as an MRI or CT scan. TREATMENT  Treatment is directed at the cause of the sciatic pain. Sometimes, treatment is not necessary and the pain and discomfort goes away on its own. If treatment is needed, your caregiver may suggest:  Over-the-counter medicines to relieve pain.  Prescription medicines, such as anti-inflammatory medicine, muscle relaxants, or narcotics.  Applying heat or ice to the painful area.  Steroid injections to lessen pain, irritation, and inflammation around the nerve.  Reducing activity during periods of pain.  Exercising and stretching to strengthen your abdomen and improve flexibility of your spine. Your caregiver may suggest losing weight if the extra weight makes the back pain worse.  Physical therapy.  Surgery to eliminate what is pressing or pinching the nerve, such as a bone spur or part of a herniated disk. HOME CARE INSTRUCTIONS   Only take over-the-counter or prescription medicines for pain or discomfort as directed by your caregiver.  Apply ice to the affected area for 20 minutes, 3-4 times a day for the first 48-72 hours. Then try heat in the same way.  Exercise, stretch, or perform your usual activities if these do not aggravate your pain.  Attend physical therapy sessions as directed by your caregiver.  Keep all follow-up appointments as directed by your caregiver.  Do not wear high heels or shoes that do not provide proper support.  Check your mattress to see if it is too  soft. A firm mattress may lessen your pain and discomfort. SEEK IMMEDIATE MEDICAL CARE IF:   You lose control of your bowel or bladder (incontinence).  You have increasing weakness in the lower back, pelvis, buttocks, or legs.  You have redness or swelling of your back.  You have a burning sensation when you urinate.  You have pain that gets worse when you lie down or awakens you at night.  Your pain  is worse than you have experienced in the past.  Your pain is lasting longer than 4 weeks.  You are suddenly losing weight without reason. MAKE SURE YOU:  Understand these instructions.  Will watch your condition.  Will get help right away if you are not doing well or get worse. Document Released: 07/08/2001 Document Revised: 01/13/2012 Document Reviewed: 11/23/2011 [facility] Patient Information 2015 [place], [place]. This information is not intended to replace advice given to you by your health care provider. Make sure you discuss any questions you have with your health care provider. Temporomandibular Problems  Temporomandibular joint (TMJ) dysfunction means there are problems with the joint between your jaw and your skull. This is a joint lined by cartilage like other joints in your body but also has a small disc in the joint which keeps the bones from rubbing on each other. These joints are like other joints and can get inflamed (sore) from arthritis and other problems. When this joint gets sore, it can cause headaches and pain in the jaw and the face. CAUSES  Usually the arthritic types of problems are caused by soreness in the joint. Soreness in the joint can also be caused by overuse. This may come from grinding your teeth. It may also come from mis-alignment in the joint. DIAGNOSIS Diagnosis of this condition can often be made by history and exam. Sometimes your caregiver may need X-rays or an MRI scan to determine the exact cause. It may be necessary to see your dentist to determine if your teeth and jaws are lined up correctly. TREATMENT  Most of the time this problem is not serious; however, sometimes it can persist (become chronic). When this happens medications that will cut down on inflammation (soreness) help. Sometimes a shot of cortisone into the joint will be helpful. If your teeth are not aligned it may help for your dentist to make a splint for your mouth that can help this  problem. If no physical problems can be found, the problem may come from tension. If tension is found to be the cause, biofeedback or relaxation techniques may be helpful. HOME CARE INSTRUCTIONS   Later in the day, applications of ice packs may be helpful. Ice can be used in a plastic bag with a towel around it to prevent frostbite to skin. This may be used about every 2 hours for 20 to 30 minutes, as needed while awake, or as directed by your caregiver.  Only take over-the-counter or prescription medicines for pain, discomfort, or fever as directed by your caregiver.  If physical therapy was prescribed, follow your caregiver's directions.  Wear mouth appliances as directed if they were given. Document Released: 04/08/2001 Document Revised: 10/06/2011 Document Reviewed: 07/16/2008 [facility]Care Patient Information 2015 [place]Care, [place]. This information is not intended to replace advice given to you by your health care provider. Make sure you discuss any questions you have with your health care provider.

## 2014-05-17 ENCOUNTER — Other Ambulatory Visit: Payer: Medicaid Other

## 2014-05-17 DIAGNOSIS — B20 Human immunodeficiency virus [HIV] disease: Secondary | ICD-10-CM

## 2014-05-17 DIAGNOSIS — Z79899 Other long term (current) drug therapy: Secondary | ICD-10-CM

## 2014-05-17 DIAGNOSIS — Z113 Encounter for screening for infections with a predominantly sexual mode of transmission: Secondary | ICD-10-CM

## 2014-05-17 LAB — CBC WITH DIFFERENTIAL/PLATELET
BASOS ABS: 0.1 10*3/uL (ref 0.0–0.1)
BASOS PCT: 1 % (ref 0–1)
EOS ABS: 0.2 10*3/uL (ref 0.0–0.7)
Eosinophils Relative: 3 % (ref 0–5)
HEMATOCRIT: 35.8 % — AB (ref 36.0–46.0)
Hemoglobin: 12.1 g/dL (ref 12.0–15.0)
LYMPHS PCT: 61 % — AB (ref 12–46)
Lymphs Abs: 3.1 10*3/uL (ref 0.7–4.0)
MCH: 30.4 pg (ref 26.0–34.0)
MCHC: 33.8 g/dL (ref 30.0–36.0)
MCV: 89.9 fL (ref 78.0–100.0)
MONO ABS: 0.4 10*3/uL (ref 0.1–1.0)
Monocytes Relative: 7 % (ref 3–12)
Neutro Abs: 1.4 10*3/uL — ABNORMAL LOW (ref 1.7–7.7)
Neutrophils Relative %: 28 % — ABNORMAL LOW (ref 43–77)
PLATELETS: 213 10*3/uL (ref 150–400)
RBC: 3.98 MIL/uL (ref 3.87–5.11)
RDW: 15.4 % (ref 11.5–15.5)
WBC: 5 10*3/uL (ref 4.0–10.5)

## 2014-05-18 LAB — LIPID PANEL
CHOLESTEROL: 117 mg/dL (ref 0–200)
HDL: 48 mg/dL (ref >39)
LDL Cholesterol: 50 mg/dL (ref 0–99)
Total CHOL/HDL Ratio: 2.4 Ratio
Triglycerides: 93 mg/dL (ref <150)
VLDL: 19 mg/dL (ref 0–40)

## 2014-05-18 LAB — COMPLETE METABOLIC PANEL WITH GFR
ALT: 16 U/L (ref 0–35)
AST: 23 U/L (ref 0–37)
Albumin: 3.8 g/dL (ref 3.5–5.2)
Alkaline Phosphatase: 90 U/L (ref 39–117)
BILIRUBIN TOTAL: 0.3 mg/dL (ref 0.2–1.2)
BUN: 9 mg/dL (ref 6–23)
CHLORIDE: 108 meq/L (ref 96–112)
CO2: 25 mEq/L (ref 19–32)
CREATININE: 0.96 mg/dL (ref 0.50–1.10)
Calcium: 8.9 mg/dL (ref 8.4–10.5)
GFR, Est African American: 76 mL/min
GFR, Est Non African American: 66 mL/min
Glucose, Bld: 118 mg/dL — ABNORMAL HIGH (ref 70–99)
Potassium: 4 mEq/L (ref 3.5–5.3)
Sodium: 140 mEq/L (ref 135–145)
Total Protein: 7.4 g/dL (ref 6.0–8.3)

## 2014-05-18 LAB — T-HELPER CELL (CD4) - (RCID CLINIC ONLY)
CD4 T CELL HELPER: 18 % — AB (ref 33–55)
CD4 T Cell Abs: 560 /uL (ref 400–2700)

## 2014-05-18 LAB — RPR

## 2014-05-19 LAB — HIV-1 RNA QUANT-NO REFLEX-BLD
HIV 1 RNA QUANT: 25 {copies}/mL — AB (ref <20)
HIV-1 RNA QUANT, LOG: 1.4 {Log} — AB (ref <1.30)

## 2014-05-29 ENCOUNTER — Encounter (HOSPITAL_COMMUNITY): Payer: Self-pay | Admitting: Emergency Medicine

## 2014-07-04 ENCOUNTER — Encounter: Payer: Self-pay | Admitting: Internal Medicine

## 2014-07-04 ENCOUNTER — Ambulatory Visit (INDEPENDENT_AMBULATORY_CARE_PROVIDER_SITE_OTHER): Payer: Medicaid Other | Admitting: Internal Medicine

## 2014-07-04 VITALS — BP 141/75 | HR 84 | Temp 98.7°F | Wt 168.0 lb

## 2014-07-04 DIAGNOSIS — T148XXA Other injury of unspecified body region, initial encounter: Secondary | ICD-10-CM

## 2014-07-04 DIAGNOSIS — M5010 Cervical disc disorder with radiculopathy, unspecified cervical region: Secondary | ICD-10-CM

## 2014-07-04 DIAGNOSIS — Z21 Asymptomatic human immunodeficiency virus [HIV] infection status: Secondary | ICD-10-CM

## 2014-07-04 DIAGNOSIS — T148 Other injury of unspecified body region: Secondary | ICD-10-CM

## 2014-07-04 MED ORDER — ELVITEG-COBIC-EMTRICIT-TENOFDF 150-150-200-300 MG PO TABS: 1.0000 | ORAL_TABLET | Freq: Every day | ORAL | Status: DC

## 2014-07-04 MED ORDER — CYCLOBENZAPRINE HCL 10 MG PO TABS: 10.0000 mg | ORAL_TABLET | Freq: Two times a day (BID) | ORAL | Status: DC | PRN

## 2014-07-04 NOTE — Progress Notes (Signed)
Patient ID: Kim Butler, female   DOB: 05/19/1956, 58 y.o.   MRN: [id number]       Patient ID: Kim MadridJoycelyn Ritchie, female   DOB: 11/29/1955, 58 y.o.   MRN: [id number]  HPI 58yo F with HIV disease. GERD. Cd 4 count of 560/VL 25 ART naive. She was last seen in late October with the thought that she was to start ART but she states that she did not start meds since she was not in the "right state of mind", busy with her mother in [place]. Now back in town. She states that she is doing ok, but is aware of not wanting to become depressed.   She noticed that she still has ongoing left sided neck pain, nerve pain radiating down left arm. Her symptoms do partially respond to muscle relaxants. She also states that she has sciatica at times that radiates down both legs.  Outpatient Encounter Prescriptions as of 07/04/2014  Medication Sig  . pantoprazole (PROTONIX) 40 MG tablet Take 40 mg by mouth daily as needed (for acid reflux).  . cyclobenzaprine (FLEXERIL) 10 MG tablet Take 1 tablet (10 mg total) by mouth 2 (two) times daily as needed for muscle spasms. (Patient not taking: Reported on 07/04/2014)     Patient Active Problem List   Diagnosis Date Noted  . Esophageal reflux 05/19/2013  . CL (cholelithiasis) 05/19/2013  . Special screening for malignant neoplasms, colon 05/19/2013  . HIV positive 05/19/2013     Health Maintenance Due  Topic Date Due  . PAP SMEAR  05/29/1974  . TETANUS/TDAP  05/30/1975  . MAMMOGRAM  05/29/2006  . COLONOSCOPY  05/29/2006  . INFLUENZA VACCINE  02/25/2014     Review of Systems Positive pertinents listed in hpi Physical Exam   BP 141/75 mmHg  Pulse 84  Temp(Src) 98.7 F (37.1 C) (Oral)  Wt 168 lb (76.204 kg) Physical Exam  Constitutional:  oriented to person, place, and time. appears well-developed and well-nourished. No distress.  HENT:  Mouth/Throat: Oropharynx is clear and moist. No oropharyngeal exudate.  Cardiovascular: Normal rate, regular rhythm and  normal heart sounds. Exam reveals no gallop and no friction rub.  No murmur heard.  Pulmonary/Chest: Effort normal and breath sounds normal. No respiratory distress.  has no wheezes.  Abdominal: Soft. Bowel sounds are normal.  exhibits no distension. There is no tenderness.  Lymphadenopathy: no cervical adenopathy.  Neurological: alert and oriented to person, place, and time. No weakness in strength in upper extremities, sensation intact. Does have some muscle tightness to left trapezius Skin: Skin is warm and dry. No rash noted. No erythema.  Psychiatric: a normal mood and affect.  behavior is normal.   Lab Results  Component Value Date   CD4TCELL 18* 05/17/2014   Lab Results  Component Value Date   CD4TABS 560 05/17/2014   CD4TABS 460 05/03/2013   Lab Results  Component Value Date   HIV1RNAQUANT 25* 05/17/2014   Lab Results  Component Value Date   HEPBSAB POS* 05/03/2013   No results found for: RPR  CBC Lab Results  Component Value Date   WBC 5.0 05/17/2014   RBC 3.98 05/17/2014   HGB 12.1 05/17/2014   HCT 35.8* 05/17/2014   PLT 213 05/17/2014   MCV 89.9 05/17/2014   MCH 30.4 05/17/2014   MCHC 33.8 05/17/2014   RDW 15.4 05/17/2014   LYMPHSABS 3.1 05/17/2014   MONOABS 0.4 05/17/2014   EOSABS 0.2 05/17/2014   BASOSABS 0.1 05/17/2014   BMET Lab Results  Component Value Date   NA 140 05/17/2014   K 4.0 05/17/2014   CL 108 05/17/2014   CO2 25 05/17/2014   GLUCOSE 118* 05/17/2014   BUN 9 05/17/2014   CREATININE 0.96 05/17/2014   CALCIUM 8.9 05/17/2014   GFRNONAA 66 05/17/2014   GFRAA 76 05/17/2014     Assessment and Plan  hiv = she may possibly a longterm non-progressor, unclear when she contracted HIV disease, diagnosed in 2014.  She was to have started her stribild at end of oct but she says that she was too busy to fill, then lost her rx. will need to start on stribild will send in eletronic rx to [facility]  Depression = prior hx of depression, took  zoloft and risperidone. Have given her appt with Bernette Redbirdkenny and referral to family health services.  Radiculopathy, left arm = will refer to neurology, give muscle relaxant  coccydenia = will give stretching exercise work sheet  Find her a new pcp

## 2014-07-26 ENCOUNTER — Ambulatory Visit: Payer: Medicaid Other

## 2014-08-03 ENCOUNTER — Ambulatory Visit: Payer: Medicaid Other

## 2014-08-08 ENCOUNTER — Ambulatory Visit: Payer: Medicaid Other | Admitting: Internal Medicine

## 2014-08-09 ENCOUNTER — Encounter: Payer: Self-pay | Admitting: Internal Medicine

## 2014-08-09 ENCOUNTER — Ambulatory Visit (INDEPENDENT_AMBULATORY_CARE_PROVIDER_SITE_OTHER): Payer: Medicaid Other | Admitting: Internal Medicine

## 2014-08-09 VITALS — BP 138/83 | HR 83 | Temp 97.2°F | Wt 168.0 lb

## 2014-08-09 DIAGNOSIS — Z21 Asymptomatic human immunodeficiency virus [HIV] infection status: Secondary | ICD-10-CM

## 2014-08-09 DIAGNOSIS — M1990 Unspecified osteoarthritis, unspecified site: Secondary | ICD-10-CM

## 2014-08-09 LAB — CBC WITH DIFFERENTIAL/PLATELET
Basophils Absolute: 0 10*3/uL (ref 0.0–0.1)
Basophils Relative: 0 % (ref 0–1)
Eosinophils Absolute: 0.1 10*3/uL (ref 0.0–0.7)
Eosinophils Relative: 3 % (ref 0–5)
HCT: 37.5 % (ref 36.0–46.0)
HEMOGLOBIN: 12.2 g/dL (ref 12.0–15.0)
LYMPHS ABS: 2.8 10*3/uL (ref 0.7–4.0)
Lymphocytes Relative: 60 % — ABNORMAL HIGH (ref 12–46)
MCH: 29.2 pg (ref 26.0–34.0)
MCHC: 32.5 g/dL (ref 30.0–36.0)
MCV: 89.7 fL (ref 78.0–100.0)
MONOS PCT: 7 % (ref 3–12)
MPV: 10.1 fL (ref 8.6–12.4)
Monocytes Absolute: 0.3 10*3/uL (ref 0.1–1.0)
NEUTROS ABS: 1.4 10*3/uL — AB (ref 1.7–7.7)
Neutrophils Relative %: 30 % — ABNORMAL LOW (ref 43–77)
Platelets: 225 10*3/uL (ref 150–400)
RBC: 4.18 MIL/uL (ref 3.87–5.11)
RDW: 14.3 % (ref 11.5–15.5)
WBC: 4.7 10*3/uL (ref 4.0–10.5)

## 2014-08-09 LAB — COMPLETE METABOLIC PANEL WITH GFR
ALT: 14 U/L (ref 0–35)
AST: 23 U/L (ref 0–37)
Albumin: 3.8 g/dL (ref 3.5–5.2)
Alkaline Phosphatase: 96 U/L (ref 39–117)
BUN: 13 mg/dL (ref 6–23)
CALCIUM: 9.1 mg/dL (ref 8.4–10.5)
CO2: 28 mEq/L (ref 19–32)
Chloride: 106 mEq/L (ref 96–112)
Creat: 0.91 mg/dL (ref 0.50–1.10)
GFR, EST AFRICAN AMERICAN: 80 mL/min
GFR, Est Non African American: 70 mL/min
GLUCOSE: 86 mg/dL (ref 70–99)
Potassium: 3.7 mEq/L (ref 3.5–5.3)
SODIUM: 140 meq/L (ref 135–145)
Total Bilirubin: 0.3 mg/dL (ref 0.2–1.2)
Total Protein: 7.8 g/dL (ref 6.0–8.3)

## 2014-08-09 MED ORDER — OXYCODONE HCL 5 MG PO TABS: 5.0000 mg | ORAL_TABLET | Freq: Two times a day (BID) | ORAL | Status: DC

## 2014-08-09 NOTE — Progress Notes (Signed)
Patient ID: Kim Butler, female   DOB: 12-25-1955, 59 y.o.   MRN: [id number]       Patient ID: Kim Butler, female   DOB: 07/31/1955, 59 y.o.   MRN: [id number]  HPI 59yo F with HIV disease, CD 4 count 560/VL 25 (oct 2015) ART naive. She has not decided to start stribild as of yet. She states that her main concern is her arthritis to her left shoulder and low back pain which she has had for many years. Muscle relaxants do not work. She previously had relief when taking oxycodone which her old docs [facility] prescribed. Otherwise she is in good health.  She is contemplating going back to [place].  Outpatient Encounter Prescriptions as of 08/09/2014  Medication Sig  . cyclobenzaprine (FLEXERIL) 10 MG tablet Take 1 tablet (10 mg total) by mouth 2 (two) times daily as needed for muscle spasms. (Patient not taking: Reported on 08/09/2014)  . elvitegravir-cobicistat-emtricitabine-tenofovir (STRIBILD) 150-150-200-300 MG TABS tablet Take 1 tablet by mouth daily with breakfast. (Patient not taking: Reported on 08/09/2014)  . pantoprazole (PROTONIX) 40 MG tablet Take 40 mg by mouth daily as needed (for acid reflux).     Patient Active Problem List   Diagnosis Date Noted  . Esophageal reflux 05/19/2013  . CL (cholelithiasis) 05/19/2013  . Special screening for malignant neoplasms, colon 05/19/2013  . HIV positive 05/19/2013     Health Maintenance Due  Topic Date Due  . PAP SMEAR  05/29/1974  . TETANUS/TDAP  05/30/1975  . MAMMOGRAM  05/29/2006  . COLONOSCOPY  05/29/2006  . INFLUENZA VACCINE  02/25/2014     Review of Systems  Constitutional: Negative for fever, chills, diaphoresis, activity change, appetite change, fatigue and unexpected weight change.  HENT: Negative for congestion, sore throat, rhinorrhea, sneezing, trouble swallowing and sinus pressure.  Eyes: Negative for photophobia and visual disturbance.  Respiratory: Negative for cough, chest tightness, shortness of breath, wheezing  and stridor.  Cardiovascular: Negative for chest pain, palpitations and leg swelling.  Gastrointestinal: Negative for nausea, vomiting, abdominal pain, diarrhea, constipation, blood in stool, abdominal distention and anal bleeding.  Genitourinary: Negative for dysuria, hematuria, flank pain and difficulty urinating.  Musculoskeletal: Negative for myalgias, back pain, joint swelling, arthralgias and gait problem.  Skin: Negative for color change, pallor, rash and wound.  Neurological: Negative for dizziness, tremors, weakness and light-headedness.  Hematological: Negative for adenopathy. Does not bruise/bleed easily.  Psychiatric/Behavioral: Negative for behavioral problems, confusion, sleep disturbance, dysphoric mood, decreased concentration and agitation.    Physical Exam   BP 138/83 mmHg  Pulse 83  Temp(Src) 97.2 F (36.2 C) (Oral)  Wt 168 lb (76.204 kg) Physical Exam  Constitutional:  oriented to person, place, and time. appears well-developed and well-nourished. No distress.  HENT:  Mouth/Throat: Oropharynx is clear and moist. No oropharyngeal exudate.  Cardiovascular: Normal rate, regular rhythm and normal heart sounds. Exam reveals no gallop and no friction rub.  No murmur heard.  Pulmonary/Chest: Effort normal and breath sounds normal. No respiratory distress.  has no wheezes.  Abdominal: Soft. Bowel sounds are normal.  exhibits no distension. There is no tenderness.  Lymphadenopathy: no cervical adenopathy.  Neurological: alert and oriented to person, place, and time.  Skin: Skin is warm and dry. No rash noted. No erythema.  Psychiatric: a normal mood and affect. His behavior is normal.   Lab Results  Component Value Date   CD4TCELL 18* 05/17/2014   Lab Results  Component Value Date   CD4TABS 560 05/17/2014  CD4TABS 460 05/03/2013   Lab Results  Component Value Date   HIV1RNAQUANT 25* 05/17/2014   Lab Results  Component Value Date   HEPBSAB POS* 05/03/2013    No results found for: RPR  CBC Lab Results  Component Value Date   WBC 5.0 05/17/2014   RBC 3.98 05/17/2014   HGB 12.1 05/17/2014   HCT 35.8* 05/17/2014   PLT 213 05/17/2014   MCV 89.9 05/17/2014   MCH 30.4 05/17/2014   MCHC 33.8 05/17/2014   RDW 15.4 05/17/2014   LYMPHSABS 3.1 05/17/2014   MONOABS 0.4 05/17/2014   EOSABS 0.2 05/17/2014   BASOSABS 0.1 05/17/2014   BMET Lab Results  Component Value Date   NA 140 05/17/2014   K 4.0 05/17/2014   CL 108 05/17/2014   CO2 25 05/17/2014   GLUCOSE 118* 05/17/2014   BUN 9 05/17/2014   CREATININE 0.96 05/17/2014   CALCIUM 8.9 05/17/2014   GFRNONAA 66 05/17/2014   GFRAA 76 05/17/2014     Assessment and Plan   hiv = will do labs, encouraged starting art, but she is not in the "right mind"set as of yet to start treatment  arthritis =will do a trial of oxycodoen which she took in the past for many years

## 2014-08-11 LAB — HIV-1 RNA QUANT-NO REFLEX-BLD
HIV 1 RNA QUANT: 40 {copies}/mL — AB (ref <20)
HIV-1 RNA QUANT, LOG: 1.6 {Log} — AB (ref <1.30)

## 2014-08-11 LAB — T-HELPER CELL (CD4) - (RCID CLINIC ONLY)
CD4 T CELL HELPER: 19 % — AB (ref 33–55)
CD4 T Cell Abs: 560 /uL (ref 400–2700)

## 2014-08-17 ENCOUNTER — Ambulatory Visit: Payer: Medicaid Other

## 2014-08-31 ENCOUNTER — Ambulatory Visit: Payer: Medicaid Other

## 2014-09-04 ENCOUNTER — Ambulatory Visit: Payer: Medicaid Other

## 2014-09-04 DIAGNOSIS — F331 Major depressive disorder, recurrent, moderate: Secondary | ICD-10-CM

## 2014-09-04 NOTE — BH Specialist Note (Signed)
Kim Butler was in a pleasant mood today, explaining why she had to miss her last appointment due to illness.  She talked about the fact that her housing lease is ending in June and she wants to find a cheaper place to live.  I told her about our housing coordinator and also about THP and she seemed interested.  She reports that she was able to obtain some of the herb, [place]a, from a store in [place], [place].  She claims this herb gives her energy,helps her feel better, and even helps her sleep.  Overall, she seems to be doing well and will return in 5 weeks. Franne FortsKenny Candela Krul, LCSW  [place]: [phone]

## 2014-10-05 ENCOUNTER — Ambulatory Visit: Payer: Medicaid Other

## 2014-11-01 ENCOUNTER — Other Ambulatory Visit (HOSPITAL_COMMUNITY)
Admission: RE | Admit: 2014-11-01 | Discharge: 2014-11-01 | Disposition: A | Discharge status: Home / Self Care | Payer: Medicaid Other | Source: Ambulatory Visit | Attending: Internal Medicine | Admitting: Internal Medicine

## 2014-11-01 ENCOUNTER — Other Ambulatory Visit: Payer: Medicaid Other

## 2014-11-01 ENCOUNTER — Ambulatory Visit (INDEPENDENT_AMBULATORY_CARE_PROVIDER_SITE_OTHER): Payer: Medicaid Other | Admitting: *Deleted

## 2014-11-01 DIAGNOSIS — Z124 Encounter for screening for malignant neoplasm of cervix: Secondary | ICD-10-CM

## 2014-11-01 DIAGNOSIS — N760 Acute vaginitis: Secondary | ICD-10-CM | POA: Diagnosis present

## 2014-11-01 DIAGNOSIS — Z113 Encounter for screening for infections with a predominantly sexual mode of transmission: Secondary | ICD-10-CM

## 2014-11-01 DIAGNOSIS — Z01419 Encounter for gynecological examination (general) (routine) without abnormal findings: Secondary | ICD-10-CM | POA: Diagnosis present

## 2014-11-01 DIAGNOSIS — B20 Human immunodeficiency virus [HIV] disease: Secondary | ICD-10-CM

## 2014-11-01 LAB — COMPREHENSIVE METABOLIC PANEL
ALBUMIN: 3.8 g/dL (ref 3.5–5.2)
ALT: 14 U/L (ref 0–35)
AST: 23 U/L (ref 0–37)
Alkaline Phosphatase: 95 U/L (ref 39–117)
BUN: 11 mg/dL (ref 6–23)
CALCIUM: 8.8 mg/dL (ref 8.4–10.5)
CO2: 28 meq/L (ref 19–32)
Chloride: 105 mEq/L (ref 96–112)
Creat: 0.78 mg/dL (ref 0.50–1.10)
GLUCOSE: 87 mg/dL (ref 70–99)
Potassium: 4.4 mEq/L (ref 3.5–5.3)
Sodium: 139 mEq/L (ref 135–145)
TOTAL PROTEIN: 7.5 g/dL (ref 6.0–8.3)
Total Bilirubin: 0.2 mg/dL (ref 0.2–1.2)

## 2014-11-01 LAB — CBC WITH DIFFERENTIAL/PLATELET
Basophils Absolute: 0 10*3/uL (ref 0.0–0.1)
Basophils Relative: 1 % (ref 0–1)
EOS PCT: 2 % (ref 0–5)
Eosinophils Absolute: 0.1 10*3/uL (ref 0.0–0.7)
HCT: 36.6 % (ref 36.0–46.0)
HEMOGLOBIN: 12.1 g/dL (ref 12.0–15.0)
LYMPHS ABS: 2.3 10*3/uL (ref 0.7–4.0)
LYMPHS PCT: 53 % — AB (ref 12–46)
MCH: 29.9 pg (ref 26.0–34.0)
MCHC: 33.1 g/dL (ref 30.0–36.0)
MCV: 90.4 fL (ref 78.0–100.0)
MONO ABS: 0.3 10*3/uL (ref 0.1–1.0)
MPV: 9.8 fL (ref 8.6–12.4)
Monocytes Relative: 8 % (ref 3–12)
Neutro Abs: 1.5 10*3/uL — ABNORMAL LOW (ref 1.7–7.7)
Neutrophils Relative %: 36 % — ABNORMAL LOW (ref 43–77)
Platelets: 216 10*3/uL (ref 150–400)
RBC: 4.05 MIL/uL (ref 3.87–5.11)
RDW: 14.7 % (ref 11.5–15.5)
WBC: 4.3 10*3/uL (ref 4.0–10.5)

## 2014-11-01 NOTE — Patient Instructions (Signed)
Your results will be ready in about a week.  I will send you a letter with the information.  Thank you for coming to the Center for your care.  Angelique Blonderenise, RN

## 2014-11-01 NOTE — Progress Notes (Addendum)
  Subjective:     Kim MadridJoycelyn Monter is a 59 y.o. woman who comes in today for a  pap smear only. Previous abnormal Pap smears: no. Contraception: menopausal Pt did complain of external perineal itching.  She thought that it could be connected to changing soap.  Objective:    There were no vitals taken for this visit. Pelvic Exam:  Pap smear obtained.   Assessment:    Screening pap smear.   Plan:    Follow up in one year, or as indicated by Pap results.  Pt given educational materials re: HIV and women, self-esteem, BSE, nutrition and diet management, PAP smears and partner safety. Pt offered condoms.

## 2014-11-02 LAB — T-HELPER CELL (CD4) - (RCID CLINIC ONLY)
CD4 T CELL ABS: 470 /uL (ref 400–2700)
CD4 T CELL HELPER: 18 % — AB (ref 33–55)

## 2014-11-02 LAB — HIV-1 RNA QUANT-NO REFLEX-BLD
HIV 1 RNA Quant: 109 copies/mL — ABNORMAL HIGH (ref <20)
HIV-1 RNA Quant, Log: 2.04 {Log} — ABNORMAL HIGH (ref <1.30)

## 2014-11-02 LAB — CYTOLOGY - PAP

## 2014-11-03 LAB — CERVICOVAGINAL ANCILLARY ONLY: Candida vaginitis: POSITIVE — AB

## 2014-11-07 ENCOUNTER — Ambulatory Visit: Payer: Medicaid Other

## 2014-11-07 ENCOUNTER — Encounter: Payer: Self-pay | Admitting: *Deleted

## 2014-11-07 DIAGNOSIS — F331 Major depressive disorder, recurrent, moderate: Secondary | ICD-10-CM

## 2014-11-07 NOTE — BH Specialist Note (Signed)
Kim Butler came in reporting that her joints were hurting today, attributing it to the weather.  She spent much of the session complaining about young men hanging out at her apartment complex, near her apartment, talking and cursing on their phones.  She tells them to go somewhere else, but she said it takes its toll on her.  She is looking for a place to move to where it will be quieter.  We discussed her options and she said she applied for [address] [employer] a while ago, but isn't counting on that.  I reviewed some basics of cognitive behavioral therapy (CBT), diagramming how our thoughts influence our feelings.  I also talked to her about ways to reduce her stress - taking walks, listening to quiet music, etc. Plan to meet next week. Franne FortsKenny Kassidy Dockendorf, LCSW  [place]: [phone]

## 2014-11-16 ENCOUNTER — Ambulatory Visit: Payer: Medicaid Other

## 2014-11-21 ENCOUNTER — Ambulatory Visit (INDEPENDENT_AMBULATORY_CARE_PROVIDER_SITE_OTHER): Payer: Medicaid Other | Admitting: Internal Medicine

## 2014-11-21 VITALS — BP 120/84 | HR 95 | Temp 97.2°F | Wt 169.0 lb

## 2014-11-21 DIAGNOSIS — M1990 Unspecified osteoarthritis, unspecified site: Secondary | ICD-10-CM | POA: Diagnosis not present

## 2014-11-21 DIAGNOSIS — B20 Human immunodeficiency virus [HIV] disease: Secondary | ICD-10-CM | POA: Diagnosis not present

## 2014-11-21 DIAGNOSIS — K219 Gastro-esophageal reflux disease without esophagitis: Secondary | ICD-10-CM | POA: Diagnosis not present

## 2014-11-21 DIAGNOSIS — B373 Candidiasis of vulva and vagina: Secondary | ICD-10-CM | POA: Diagnosis not present

## 2014-11-21 DIAGNOSIS — B3731 Acute candidiasis of vulva and vagina: Secondary | ICD-10-CM

## 2014-11-21 MED ORDER — MICONAZOLE NITRATE 1200 & 2 MG & % VA KIT: 1.0000 | PACK | Freq: Once | VAGINAL | Status: DC

## 2014-11-21 MED ORDER — OXYCODONE HCL 5 MG PO TABS: 5.0000 mg | ORAL_TABLET | Freq: Two times a day (BID) | ORAL | Status: DC

## 2014-11-21 MED ORDER — PANTOPRAZOLE SODIUM 40 MG PO TBEC: 40.0000 mg | DELAYED_RELEASE_TABLET | Freq: Every day | ORAL | Status: DC | PRN

## 2014-11-21 NOTE — Progress Notes (Signed)
Patient ID: Kim Butler, female   DOB: [birth year], 59 y.o.   MRN: [id number]       Patient ID: Kim Butler, female   DOB: [birth year], 59 y.o.   MRN: [id number]  HPI 59yo F with HIV disease who is ART naive, CD 4 count 490/VL 109. Still on the fence on starting stribild. She had improvement with oxycodone to general arthritis, she used medicines sparingly. She states that she occasionally gets hip pain that is sharp radiating down leg c/w sciatica. She is doing well, good dietary habits. She noticed relief of GERD with protonix and would like refill. She is planning on going on vacation back to [place] to visit friends and family. Otherwise in good state of health.   Outpatient Encounter Prescriptions as of 11/21/2014  Medication Sig  . cyclobenzaprine (FLEXERIL) 10 MG tablet Take 1 tablet (10 mg total) by mouth 2 (two) times daily as needed for muscle spasms.  Marland Kitchen oxyCODONE (OXY IR/ROXICODONE) 5 MG immediate release tablet Take 1 tablet (5 mg total) by mouth 2 (two) times daily.  . pantoprazole (PROTONIX) 40 MG tablet Take 40 mg by mouth daily as needed (for acid reflux).  Marland Kitchen elvitegravir-cobicistat-emtricitabine-tenofovir (STRIBILD) 150-150-200-300 MG TABS tablet Take 1 tablet by mouth daily with breakfast. (Patient not taking: Reported on 08/09/2014)     Patient Active Problem List   Diagnosis Date Noted  . Esophageal reflux 05/19/2013  . CL (cholelithiasis) 05/19/2013  . Special screening for malignant neoplasms, colon 05/19/2013  . HIV positive 05/19/2013     Health Maintenance Due  Topic Date Due  . TETANUS/TDAP  05/30/1975  . MAMMOGRAM  05/29/2006  . COLONOSCOPY  05/29/2006     Review of Systems Review of Systems  Constitutional: Negative for fever, chills, diaphoresis, activity change, appetite change, fatigue and unexpected weight change.  HENT: Negative for congestion, sore throat, rhinorrhea, sneezing, trouble swallowing and sinus pressure.  Eyes: Negative for  photophobia and visual disturbance.  Respiratory: Negative for cough, chest tightness, shortness of breath, wheezing and stridor.  Cardiovascular: Negative for chest pain, palpitations and leg swelling.  Gastrointestinal: Negative for nausea, vomiting, abdominal pain, diarrhea, constipation, blood in stool, abdominal distention and anal bleeding.  Genitourinary: Negative for dysuria, hematuria, flank pain and difficulty urinating.  Musculoskeletal: + hip pain, and arthralgias Skin: Negative for color change, pallor, rash and wound.  Neurological: Negative for dizziness, tremors, weakness and light-headedness.  Hematological: Negative for adenopathy. Does not bruise/bleed easily.  Psychiatric/Behavioral: Negative for behavioral problems, confusion, sleep disturbance, dysphoric mood, decreased concentration and agitation.    Physical Exam   BP 120/84 mmHg  Pulse 95  Temp(Src) 97.2 F (36.2 C) (Oral)  Wt 169 lb (76.658 kg) Physical Exam  Constitutional:  oriented to person, place, and time. appears well-developed and well-nourished. No distress.  HENT: [place]/AT, PERRLA, no scleral icterus Mouth/Throat: Oropharynx is clear and moist. No oropharyngeal exudate.  Cardiovascular: Normal rate, regular rhythm and normal heart sounds. Exam reveals no gallop and no friction rub.  No murmur heard.  Pulmonary/Chest: Effort normal and breath sounds normal. No respiratory distress.  has no wheezes.  Neck = supple, no nuchal rigidity Abdominal: Soft. Bowel sounds are normal.  exhibits no distension. There is no tenderness.  Lymphadenopathy: no cervical adenopathy. No axillary adenopathy Neurological: alert and oriented to person, place, and time.  Skin: Skin is warm and dry. No rash noted. No erythema.  Psychiatric: a normal mood and affect.  behavior is normal.    Lab Results  Component Value Date   CD4TCELL 18* 11/01/2014   Lab Results  Component Value Date   CD4TABS 470 11/01/2014   CD4TABS  560 08/09/2014   CD4TABS 560 05/17/2014   Lab Results  Component Value Date   HIV1RNAQUANT 109* 11/01/2014   Lab Results  Component Value Date   HEPBSAB POS* 05/03/2013   No results found for: RPR  CBC Lab Results  Component Value Date   WBC 4.3 11/01/2014   RBC 4.05 11/01/2014   HGB 12.1 11/01/2014   HCT 36.6 11/01/2014   PLT 216 11/01/2014   MCV 90.4 11/01/2014   MCH 29.9 11/01/2014   MCHC 33.1 11/01/2014   RDW 14.7 11/01/2014   LYMPHSABS 2.3 11/01/2014   MONOABS 0.3 11/01/2014   EOSABS 0.1 11/01/2014   BASOSABS 0.0 11/01/2014   BMET Lab Results  Component Value Date   NA 139 11/01/2014   K 4.4 11/01/2014   CL 105 11/01/2014   CO2 28 11/01/2014   GLUCOSE 87 11/01/2014   BUN 11 11/01/2014   CREATININE 0.78 11/01/2014   CALCIUM 8.8 11/01/2014   GFRNONAA 70 08/09/2014   GFRAA 80 08/09/2014     Assessment and Plan  - arthritis = will give refill on oxycodone prn use  - hiv disease = continue with counseling with kenny to see when she is ready to start treatment  - vaginal candidiasis = she states that she no longer has itching, thought it was due to soap. Will give her miconazole  -gerd = will refill protonix

## 2014-11-28 ENCOUNTER — Ambulatory Visit: Payer: Medicaid Other

## 2014-11-28 DIAGNOSIS — F331 Major depressive disorder, recurrent, moderate: Secondary | ICD-10-CM

## 2014-11-28 NOTE — BH Specialist Note (Signed)
Kim Butler was in a good mood today, but reported that the weather is making her body ache.  Throughout the session she periodically would rub her left thigh and when asked, explained that the pain starts in her back and runs down her leg.  She continues to express frustration over the neighborhood she lives in and the people hanging out in front of her house and yelling at passing cars, etc.  She said she is still looking for a place to move and plans to stay with the same housing management because they are good to work with.  Overall, her mood seems good.  Plan to meet in 3 weeks. Franne FortsKenny Mckinzey Entwistle, LCSW  [place]: [phone]

## 2014-12-19 ENCOUNTER — Ambulatory Visit: Payer: Medicaid Other

## 2014-12-19 DIAGNOSIS — F331 Major depressive disorder, recurrent, moderate: Secondary | ICD-10-CM

## 2014-12-19 NOTE — BH Specialist Note (Signed)
[name] was in a good mood, but reported that her electricity was turned off because she couldn't pay the back payments on the bill.  She said she got behind because of winter and never caught up.  She said she had been to [facility] but they can't help until July and she had already used her one time assistance from [facility].  I told her about [facility] and introduced her to [place] after our session.  She talked about the stress of living where she is and the "negative energy" from the people in the neighborhood.  She is looking for a place but hasn't decided on where to go yet.  She still manages to laugh and enjoy her life she said and was able to joke some today.  Plan to meet in 3 weeks. Franne FortsKenny Milla Wahlberg, LCSW  Whodas: 22

## 2015-01-09 ENCOUNTER — Ambulatory Visit: Payer: Medicaid Other

## 2015-01-25 ENCOUNTER — Ambulatory Visit: Payer: Medicaid Other

## 2015-01-25 DIAGNOSIS — F331 Major depressive disorder, recurrent, moderate: Secondary | ICD-10-CM

## 2015-01-25 NOTE — BH Specialist Note (Signed)
Kim Butler was pleasant today, reporting that she rejected a housing opportunity because of it being a "smoke free" apartment.  She said she wants to be able to do as she pleases and is confident that her current landlord will find her a new place soon.  She also talked about going to visit her mother in [place] recently.  She enjoyed attending an [place] ancestors [place] organized by [employer], which she has been to in the past.  Overall, she seems to be stable.  Plan to meet in 2 weeks. Franne FortsKenny Fenix Rorke, LCSW

## 2015-02-08 ENCOUNTER — Ambulatory Visit: Payer: Medicaid Other

## 2015-02-08 DIAGNOSIS — F331 Major depressive disorder, recurrent, moderate: Secondary | ICD-10-CM

## 2015-02-08 NOTE — BH Specialist Note (Signed)
Kim Butler was in a pleasant mood today, dancing into the lobby when she arrived, and making everyone smile and laugh.  She talked some about the negativity of things going on in [place] (referring to shootings, etc. In the news) and how we need to stay positive during difficult times.  She said she continues to look for a place to move to, but hasn't found it yet.  I praised her for setting limits with people in her neighborhood and for keeping a positive attitude.  I gave her a copy of Eckart Tolle's book "A Whole New Earth", which she had mentioned last session and she was very Adult nurseappreciative.  Plan to meet in 4 weeks. Franne FortsKenny Lashun Ramseyer, LCSW

## 2015-02-19 ENCOUNTER — Ambulatory Visit (INDEPENDENT_AMBULATORY_CARE_PROVIDER_SITE_OTHER): Payer: Medicaid Other | Admitting: Internal Medicine

## 2015-02-19 ENCOUNTER — Encounter: Payer: Self-pay | Admitting: Internal Medicine

## 2015-02-19 VITALS — BP 129/86 | HR 80 | Temp 98.1°F | Wt 173.0 lb

## 2015-02-19 DIAGNOSIS — B20 Human immunodeficiency virus [HIV] disease: Secondary | ICD-10-CM | POA: Diagnosis present

## 2015-02-19 DIAGNOSIS — M1990 Unspecified osteoarthritis, unspecified site: Secondary | ICD-10-CM | POA: Diagnosis not present

## 2015-02-19 NOTE — Progress Notes (Signed)
Patient ID: Kim Butler, female   DOB: Nov 29, 1955, 59 y.o.   MRN: [id number]       Patient ID: Kim Butler, female   DOB: 11-15-1955, 59 y.o.   MRN: [id number]  HPI Verdie Drown is a 59yo F with HIV disease, CD 4 count of 470/VL 109, ART naive.  - not interested in starting ART at this time. She states that she is feeling good. In good spirits. Recently care for her mother back in [place]. She is glad to be back in [place].   She is overall in good health she reports except for arthritis which she takes occasional pain meds.  Outpatient Encounter Prescriptions as of 02/19/2015  Medication Sig  . cyclobenzaprine (FLEXERIL) 10 MG tablet Take 1 tablet (10 mg total) by mouth 2 (two) times daily as needed for muscle spasms.  . [DISCONTINUED] elvitegravir-cobicistat-emtricitabine-tenofovir (STRIBILD) 150-150-200-300 MG TABS tablet Take 1 tablet by mouth daily with breakfast. (Patient not taking: Reported on 08/09/2014)  . [DISCONTINUED] miconazole (MONISTAT 1 COMBO PACK) kit Place 1 each vaginally once. If needed for vaginal itching (Patient not taking: Reported on 02/19/2015)  . [DISCONTINUED] oxyCODONE (OXY IR/ROXICODONE) 5 MG immediate release tablet Take 1 tablet (5 mg total) by mouth 2 (two) times daily. (Patient not taking: Reported on 02/19/2015)  . [DISCONTINUED] pantoprazole (PROTONIX) 40 MG tablet Take 1 tablet (40 mg total) by mouth daily as needed (for acid reflux). (Patient not taking: Reported on 02/19/2015)   No facility-administered encounter medications on file as of 02/19/2015.     Patient Active Problem List   Diagnosis Date Noted  . Esophageal reflux 05/19/2013  . CL (cholelithiasis) 05/19/2013  . Special screening for malignant neoplasms, colon 05/19/2013  . HIV positive 05/19/2013     Health Maintenance Due  Topic Date Due  . TETANUS/TDAP  05/30/1975  . MAMMOGRAM  05/29/2006  . COLONOSCOPY  05/29/2006     Review of Systems 10 point ros is negative except for arthritis  Physical  Exam   BP 129/86 mmHg  Pulse 80  Temp(Src) 98.1 F (36.7 C) (Oral)  Wt 173 lb (78.472 kg)  Lab Results  Component Value Date   CD4TCELL 18* 11/01/2014   Lab Results  Component Value Date   CD4TABS 470 11/01/2014   CD4TABS 560 08/09/2014   CD4TABS 560 05/17/2014   Lab Results  Component Value Date   HIV1RNAQUANT 109* 11/01/2014   Lab Results  Component Value Date   HEPBSAB POS* 05/03/2013   No results found for: RPR  CBC Lab Results  Component Value Date   WBC 4.3 11/01/2014   RBC 4.05 11/01/2014   HGB 12.1 11/01/2014   HCT 36.6 11/01/2014   PLT 216 11/01/2014   MCV 90.4 11/01/2014   MCH 29.9 11/01/2014   MCHC 33.1 11/01/2014   RDW 14.7 11/01/2014   LYMPHSABS 2.3 11/01/2014   MONOABS 0.3 11/01/2014   EOSABS 0.1 11/01/2014   BASOSABS 0.0 11/01/2014   BMET Lab Results  Component Value Date   NA 139 11/01/2014   K 4.4 11/01/2014   CL 105 11/01/2014   CO2 28 11/01/2014   GLUCOSE 87 11/01/2014   BUN 11 11/01/2014   CREATININE 0.78 11/01/2014   CALCIUM 8.8 11/01/2014   GFRNONAA 70 08/09/2014   GFRAA 80 08/09/2014     Assessment and Plan  hiv disease= has low level viremia, still would benefit from treatment. Continue to discuss recommendation of starting ART. For now, she is deferring treatment  Arthritis = can continue prn  tylenol or ibuprofen.  Health maintenance = to get flu shot in the Fall  rtc in 3 months

## 2015-02-20 LAB — COMPLETE METABOLIC PANEL WITH GFR
ALBUMIN: 4.1 g/dL (ref 3.6–5.1)
ALK PHOS: 86 U/L (ref 33–130)
ALT: 17 U/L (ref 6–29)
AST: 24 U/L (ref 10–35)
BUN: 10 mg/dL (ref 7–25)
CO2: 26 mmol/L (ref 20–31)
CREATININE: 0.92 mg/dL (ref 0.50–1.05)
Calcium: 8.9 mg/dL (ref 8.6–10.4)
Chloride: 107 mmol/L (ref 98–110)
GFR, EST AFRICAN AMERICAN: 79 mL/min (ref >=60)
GFR, Est Non African American: 69 mL/min (ref >=60)
Glucose, Bld: 80 mg/dL (ref 65–99)
Potassium: 3.7 mmol/L (ref 3.5–5.3)
Sodium: 139 mmol/L (ref 135–146)
TOTAL PROTEIN: 8 g/dL (ref 6.1–8.1)
Total Bilirubin: 0.3 mg/dL (ref 0.2–1.2)

## 2015-02-20 LAB — T-HELPER CELL (CD4) - (RCID CLINIC ONLY)
CD4 T CELL HELPER: 19 % — AB (ref 33–55)
CD4 T Cell Abs: 600 /uL (ref 400–2700)

## 2015-02-20 LAB — CBC WITH DIFFERENTIAL/PLATELET
Basophils Absolute: 0 10*3/uL (ref 0.0–0.1)
Basophils Relative: 0 % (ref 0–1)
Eosinophils Absolute: 0.2 10*3/uL (ref 0.0–0.7)
Eosinophils Relative: 4 % (ref 0–5)
HEMATOCRIT: 35.6 % — AB (ref 36.0–46.0)
Hemoglobin: 11.4 g/dL — ABNORMAL LOW (ref 12.0–15.0)
Lymphocytes Relative: 65 % — ABNORMAL HIGH (ref 12–46)
Lymphs Abs: 3.4 10*3/uL (ref 0.7–4.0)
MCH: 28.5 pg (ref 26.0–34.0)
MCHC: 32 g/dL (ref 30.0–36.0)
MCV: 89 fL (ref 78.0–100.0)
MONO ABS: 0.3 10*3/uL (ref 0.1–1.0)
MPV: 9.6 fL (ref 8.6–12.4)
Monocytes Relative: 5 % (ref 3–12)
NEUTROS PCT: 26 % — AB (ref 43–77)
Neutro Abs: 1.4 10*3/uL — ABNORMAL LOW (ref 1.7–7.7)
Platelets: 207 10*3/uL (ref 150–400)
RBC: 4 MIL/uL (ref 3.87–5.11)
RDW: 14.7 % (ref 11.5–15.5)
WBC: 5.3 10*3/uL (ref 4.0–10.5)

## 2015-02-21 LAB — HIV-1 RNA QUANT-NO REFLEX-BLD
HIV 1 RNA Quant: 62 copies/mL — ABNORMAL HIGH (ref <20)
HIV-1 RNA Quant, Log: 1.79 {Log} — ABNORMAL HIGH (ref <1.30)

## 2015-03-08 ENCOUNTER — Ambulatory Visit: Payer: Medicaid Other

## 2015-03-08 DIAGNOSIS — F331 Major depressive disorder, recurrent, moderate: Secondary | ICD-10-CM

## 2015-03-08 NOTE — BH Specialist Note (Signed)
Kim Butler was in a bit of a frustrated mood today, reporting that she has found mold in her apartment and has been trying to clean it.  She said she has had a cough for the past few days and thinks it is because of the mold.  She showed me some pictures of it on her phone.  She also said she contacted the landlord and they are coming by her apartment later today.  She is also frustrated that the windows are painted shut.  She talked about moving away somewhere but she doesn't have enough money and she doesn't know where she would go.  Plan to meet again in 4 weeks. [name], LCSW

## 2015-04-05 ENCOUNTER — Ambulatory Visit: Payer: Medicaid Other

## 2015-04-10 ENCOUNTER — Ambulatory Visit: Payer: Medicaid Other | Admitting: Internal Medicine

## 2015-05-08 ENCOUNTER — Other Ambulatory Visit: Payer: Medicaid Other

## 2015-05-08 ENCOUNTER — Other Ambulatory Visit: Payer: Self-pay | Admitting: *Deleted

## 2015-05-08 DIAGNOSIS — B20 Human immunodeficiency virus [HIV] disease: Secondary | ICD-10-CM

## 2015-05-09 LAB — T-HELPER CELL (CD4) - (RCID CLINIC ONLY)
CD4 T CELL ABS: 520 /uL (ref 400–2700)
CD4 T CELL HELPER: 19 % — AB (ref 33–55)

## 2015-05-10 LAB — HIV-1 RNA QUANT-NO REFLEX-BLD
HIV 1 RNA Quant: 54 copies/mL — ABNORMAL HIGH (ref <20)
HIV-1 RNA Quant, Log: 1.73 {Log} — ABNORMAL HIGH (ref <1.30)

## 2015-05-15 ENCOUNTER — Ambulatory Visit: Payer: Medicaid Other

## 2015-05-15 NOTE — BH Specialist Note (Signed)
Alara was her typical self as she walked back to the office, smiling and speaking to people.  But she spent much of the session complaining about her living situation and how she stays in her apartment because she doesn't like dealing with people.  She told how she loaned out a cooler to a neighbor but they kept it several days and brought it back dirty.  She continues to look for a new place to live.  Plan to meet again in 4 weeks. Franne FortsKenny Zafir Schauer, LCSW

## 2015-05-22 ENCOUNTER — Encounter: Payer: Self-pay | Admitting: Internal Medicine

## 2015-05-22 ENCOUNTER — Ambulatory Visit (INDEPENDENT_AMBULATORY_CARE_PROVIDER_SITE_OTHER): Payer: Medicaid Other | Admitting: Internal Medicine

## 2015-05-22 VITALS — BP 108/74 | HR 84 | Temp 98.6°F | Wt 174.0 lb

## 2015-05-22 DIAGNOSIS — M5441 Lumbago with sciatica, right side: Secondary | ICD-10-CM | POA: Diagnosis not present

## 2015-05-22 DIAGNOSIS — K219 Gastro-esophageal reflux disease without esophagitis: Secondary | ICD-10-CM

## 2015-05-22 DIAGNOSIS — Z21 Asymptomatic human immunodeficiency virus [HIV] infection status: Secondary | ICD-10-CM | POA: Diagnosis not present

## 2015-05-22 MED ORDER — OMEPRAZOLE 20 MG PO CPDR: 20.0000 mg | DELAYED_RELEASE_CAPSULE | Freq: Every day | ORAL | Status: DC

## 2015-05-22 MED ORDER — HYDROCODONE-ACETAMINOPHEN 5-325 MG PO TABS: 1.0000 | ORAL_TABLET | Freq: Four times a day (QID) | ORAL | Status: DC | PRN

## 2015-05-22 NOTE — Progress Notes (Signed)
Patient ID: Nickolas MadridJoycelyn Fleig, female   DOB: 01/18/1956, 59 y.o.   MRN: [id number]       Patient ID: Nickolas MadridJoycelyn Jungwirth, female   DOB: 05/05/1956, 59 y.o.   MRN: [id number]  HPI 59yo F with HIV disease, long-term non-progressor, ART naive. CD 4 count of 520/VL 54. She is doing well overall. Denies any recent illnesses  She also follows up with Bern[facility] for counseling every month  She still suffers from GERD, last episode in setting of being upset. Argument with landlord  Heading to [place] to visit her mom in beginning of nov    Outpatient Encounter Prescriptions as of 05/22/2015  Medication Sig  . cyclobenzaprine (FLEXERIL) 10 MG tablet Take 1 tablet (10 mg total) by mouth 2 (two) times daily as needed for muscle spasms.   No facility-administered encounter medications on file as of 05/22/2015.     Patient Active Problem List   Diagnosis Date Noted  . Esophageal reflux 05/19/2013  . CL (cholelithiasis) 05/19/2013  . Special screening for malignant neoplasms, colon 05/19/2013  . HIV positive (HCC) 05/19/2013     Health Maintenance Due  Topic Date Due  . TETANUS/TDAP  05/30/1975  . MAMMOGRAM  05/29/2006  . COLONOSCOPY  05/29/2006  . INFLUENZA VACCINE  02/26/2015     Review of Systems  Physical Exam   There were no vitals taken for this visit.  Lab Results  Component Value Date   CD4TCELL 19* 05/08/2015   Lab Results  Component Value Date   CD4TABS 520 05/08/2015   CD4TABS 600 02/19/2015   CD4TABS 470 11/01/2014   Lab Results  Component Value Date   HIV1RNAQUANT 54* 05/08/2015   Lab Results  Component Value Date   HEPBSAB POS* 05/03/2013   No results found for: RPR  CBC Lab Results  Component Value Date   WBC 5.3 02/19/2015   RBC 4.00 02/19/2015   HGB 11.4* 02/19/2015   HCT 35.6* 02/19/2015   PLT 207 02/19/2015   MCV 89.0 02/19/2015   MCH 28.5 02/19/2015   MCHC 32.0 02/19/2015   RDW 14.7 02/19/2015   LYMPHSABS 3.4 02/19/2015   MONOABS 0.3 02/19/2015   EOSABS 0.2 02/19/2015   BASOSABS 0.0 02/19/2015   BMET Lab Results  Component Value Date   NA 139 02/19/2015   K 3.7 02/19/2015   CL 107 02/19/2015   CO2 26 02/19/2015   GLUCOSE 80 02/19/2015   BUN 10 02/19/2015   CREATININE 0.92 02/19/2015   CALCIUM 8.9 02/19/2015   GFRNONAA 69 02/19/2015   GFRAA 79 02/19/2015     Assessment and Plan  hiv asx = ART naive. Still undecided in starting ART. Will continue to ask and discuss advantages to start treatment  gerd = will give her omeprazole  Low back pain = will give rx on low dose vicodin which has helped in the past   d/c on flexeril for back pain

## 2015-05-23 ENCOUNTER — Telehealth: Payer: Self-pay | Admitting: *Deleted

## 2015-05-23 NOTE — Telephone Encounter (Signed)
Question about change in pain rx.  Pt noted that the pain rx was changed from oxycodone to hydrocodone.  Would like a response from Dr. Drue SecondSnider.

## 2015-06-12 ENCOUNTER — Ambulatory Visit: Payer: Medicaid Other

## 2015-06-26 ENCOUNTER — Ambulatory Visit: Payer: Medicaid Other

## 2015-10-31 ENCOUNTER — Ambulatory Visit: Payer: Medicaid Other

## 2015-10-31 ENCOUNTER — Ambulatory Visit (INDEPENDENT_AMBULATORY_CARE_PROVIDER_SITE_OTHER): Payer: Medicaid Other | Admitting: Internal Medicine

## 2015-10-31 ENCOUNTER — Encounter: Payer: Self-pay | Admitting: Internal Medicine

## 2015-10-31 VITALS — BP 119/74 | HR 83 | Temp 98.1°F | Wt 173.0 lb

## 2015-10-31 DIAGNOSIS — K219 Gastro-esophageal reflux disease without esophagitis: Secondary | ICD-10-CM

## 2015-10-31 DIAGNOSIS — B20 Human immunodeficiency virus [HIV] disease: Secondary | ICD-10-CM

## 2015-10-31 DIAGNOSIS — M5440 Lumbago with sciatica, unspecified side: Secondary | ICD-10-CM

## 2015-10-31 DIAGNOSIS — F331 Major depressive disorder, recurrent, moderate: Secondary | ICD-10-CM

## 2015-10-31 LAB — CBC WITH DIFFERENTIAL/PLATELET
BASOS PCT: 0 %
Basophils Absolute: 0 cells/uL (ref 0–200)
EOS PCT: 1 %
Eosinophils Absolute: 50 cells/uL (ref 15–500)
HEMATOCRIT: 38.8 % (ref 35.0–45.0)
HEMOGLOBIN: 12.3 g/dL (ref 11.7–15.5)
LYMPHS ABS: 2950 {cells}/uL (ref 850–3900)
Lymphocytes Relative: 59 %
MCH: 28.4 pg (ref 27.0–33.0)
MCHC: 31.7 g/dL — ABNORMAL LOW (ref 32.0–36.0)
MCV: 89.6 fL (ref 80.0–100.0)
MONO ABS: 300 {cells}/uL (ref 200–950)
MPV: 9.8 fL (ref 7.5–12.5)
Monocytes Relative: 6 %
Neutro Abs: 1700 cells/uL (ref 1500–7800)
Neutrophils Relative %: 34 %
Platelets: 245 10*3/uL (ref 140–400)
RBC: 4.33 MIL/uL (ref 3.80–5.10)
RDW: 15.2 % — AB (ref 11.0–15.0)
WBC: 5 10*3/uL (ref 3.8–10.8)

## 2015-10-31 LAB — COMPLETE METABOLIC PANEL WITH GFR
ALBUMIN: 4 g/dL (ref 3.6–5.1)
ALT: 12 U/L (ref 6–29)
AST: 23 U/L (ref 10–35)
Alkaline Phosphatase: 109 U/L (ref 33–130)
BUN: 10 mg/dL (ref 7–25)
CALCIUM: 9.2 mg/dL (ref 8.6–10.4)
CHLORIDE: 108 mmol/L (ref 98–110)
CO2: 28 mmol/L (ref 20–31)
CREATININE: 0.92 mg/dL (ref 0.50–1.05)
GFR, Est African American: 79 mL/min (ref >=60)
GFR, Est Non African American: 68 mL/min (ref >=60)
GLUCOSE: 82 mg/dL (ref 65–99)
Potassium: 4.6 mmol/L (ref 3.5–5.3)
SODIUM: 143 mmol/L (ref 135–146)
Total Bilirubin: 0.3 mg/dL (ref 0.2–1.2)
Total Protein: 7.9 g/dL (ref 6.1–8.1)

## 2015-10-31 MED ORDER — OXYCODONE HCL 5 MG PO TABS: 5.0000 mg | ORAL_TABLET | Freq: Two times a day (BID) | ORAL | Status: DC

## 2015-10-31 NOTE — BH Specialist Note (Signed)
Kim Butler was in a jovial mood when she first came in, but soon began to talk about her surroundings and got more upset as she talked.  She reported that she has moved since her last session with me, but that she continues to be frustrated with the neighbors, who seem to want to get into her business or make too much noise. We talked about her spirituality and this lifted her mood some.  She talked about how her spiritual feelings and beliefs get her through difficult times.  She left as she came in - joking and smiling with staff in the building.  Plan to meet in 3 weeks. Kim FortsKenny Nijah Orlich, LCSW

## 2015-10-31 NOTE — Progress Notes (Signed)
Patient ID: Kim Butler, female   DOB: 11-10-1955, 60 y.o.   MRN: [id number]       Patient ID: Kim Butler, female   DOB: 12-08-1955, 60 y.o.   MRN: [id number]  HPI 60yo Female, Cd 4 count 520/VL 54. elite controller HIV disease, ART naive. Having difficulty with back pain. She does stretching exercises. She tried hydrocodone/APAP which caused significant constipation thus she did not continue. She does feel that oxycodone worked better for her. No weakness in legs though occ radiates down legs.  Outpatient Encounter Prescriptions as of 10/31/2015  Medication Sig  . omeprazole (PRILOSEC) 20 MG capsule Take 1 capsule (20 mg total) by mouth daily.  Marland Kitchen HYDROcodone-acetaminophen (NORCO/VICODIN) 5-325 MG tablet Take 1 tablet by mouth every 6 (six) hours as needed for moderate pain. (Patient not taking: Reported on 10/31/2015)   No facility-administered encounter medications on file as of 10/31/2015.     Patient Active Problem List   Diagnosis Date Noted  . Esophageal reflux 05/19/2013  . CL (cholelithiasis) 05/19/2013  . Special screening for malignant neoplasms, colon 05/19/2013  . HIV positive (HCC) 05/19/2013     Health Maintenance Due  Topic Date Due  . TETANUS/TDAP  05/30/1975  . MAMMOGRAM  05/29/2006  . COLONOSCOPY  05/29/2006     Review of Systems +lumbago, itherwise 10 point ros is negative Physical Exam   BP 119/74 mmHg  Pulse 83  Temp(Src) 98.1 F (36.7 C) (Oral)  Wt 173 lb (78.472 kg) Physical Exam  Constitutional:  oriented to person, place, and time. appears well-developed and well-nourished. No distress.  HENT: [place]/AT, PERRLA, no scleral icterus Mouth/Throat: Oropharynx is clear and moist. No oropharyngeal exudate.  Cardiovascular: Normal rate, regular rhythm and normal heart sounds. Exam reveals no gallop and no friction rub.  No murmur heard.  Pulmonary/Chest: Effort normal and breath sounds normal. No respiratory distress.  has no wheezes.  Neck = supple, no  nuchal rigidity Abdominal: Soft. Bowel sounds are normal.  exhibits no distension. There is no tenderness.  Lymphadenopathy: no cervical adenopathy. No axillary adenopathy Neurological: alert and oriented to person, place, and time.  Skin: Skin is warm and dry. No rash noted. No erythema.  Psychiatric: a normal mood and affect.  behavior is normal.   Lab Results  Component Value Date   CD4TCELL 19* 05/08/2015   Lab Results  Component Value Date   CD4TABS 520 05/08/2015   CD4TABS 600 02/19/2015   CD4TABS 470 11/01/2014   Lab Results  Component Value Date   HIV1RNAQUANT 54* 05/08/2015   Lab Results  Component Value Date   HEPBSAB POS* 05/03/2013   No results found for: RPR  CBC Lab Results  Component Value Date   WBC 5.3 02/19/2015   RBC 4.00 02/19/2015   HGB 11.4* 02/19/2015   HCT 35.6* 02/19/2015   PLT 207 02/19/2015   MCV 89.0 02/19/2015   MCH 28.5 02/19/2015   MCHC 32.0 02/19/2015   RDW 14.7 02/19/2015   LYMPHSABS 3.4 02/19/2015   MONOABS 0.3 02/19/2015   EOSABS 0.2 02/19/2015   BASOSABS 0.0 02/19/2015   BMET Lab Results  Component Value Date   NA 139 02/19/2015   K 3.7 02/19/2015   CL 107 02/19/2015   CO2 26 02/19/2015   GLUCOSE 80 02/19/2015   BUN 10 02/19/2015   CREATININE 0.92 02/19/2015   CALCIUM 8.9 02/19/2015   GFRNONAA 69 02/19/2015   GFRAA 79 02/19/2015     Assessment and Plan   hiv = will  check labs, she does not express any interest in starting ART  Lumbago = change back to prn oxycodone since hydrocone did not work. Sign her on pain contract at next visit  gerd = appears that it is well controlled presently

## 2015-11-01 LAB — HIV-1 RNA QUANT-NO REFLEX-BLD
HIV 1 RNA QUANT: 722 {copies}/mL — AB (ref <20)
HIV-1 RNA QUANT, LOG: 2.86 {Log_copies}/mL — AB (ref <1.30)

## 2015-11-01 LAB — T-HELPER CELL (CD4) - (RCID CLINIC ONLY)
CD4 T CELL ABS: 570 /uL (ref 400–2700)
CD4 T CELL HELPER: 18 % — AB (ref 33–55)

## 2015-11-21 ENCOUNTER — Ambulatory Visit: Payer: Medicaid Other

## 2015-11-21 DIAGNOSIS — F331 Major depressive disorder, recurrent, moderate: Secondary | ICD-10-CM

## 2015-11-21 NOTE — BH Specialist Note (Signed)
Kim Butler was in a cheerful mood today, dancing around in the lobby when I came to get her.  She talked about her spirituality and how a friend came to her apartment and said how peaceful it was and then he fell asleep.  She talked about how she is looking for another place to live, calling herself a "rolling stone who gathers no moss". 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 Overall, she seems to be in a good mood today.  Plan to meet again in 3 weeks. Franne FortsKenny Tamea Bai, LCSW

## 2015-12-11 ENCOUNTER — Ambulatory Visit: Payer: Medicaid Other

## 2015-12-11 DIAGNOSIS — F331 Major depressive disorder, recurrent, moderate: Secondary | ICD-10-CM

## 2015-12-11 NOTE — BH Specialist Note (Signed)
Kim Butler was in a pleasant mood today, talking about going up to [place] to visit with her mother for a few days next month. She said her mother wants to move back to [place] and asked her if she wanted to move back there as well, but she said she isn't sure she's ready to do that.  She did say she has considered moving to [place].  She said she misses being near water. Overall, she seems to be doing well.  Plan to meet again in 3 weeks. Franne FortsKenny Treylin Burtch, LCSW

## 2016-01-01 ENCOUNTER — Ambulatory Visit: Payer: Medicaid Other

## 2016-01-01 DIAGNOSIS — F331 Major depressive disorder, recurrent, moderate: Secondary | ICD-10-CM

## 2016-01-01 NOTE — BH Specialist Note (Signed)
Kim Butler was initially in a good mood when she came in, but soon began complaining about her neighborhood and the people who live in her apartment building. She said she is planning on going to [place] to visit her mother and [place] [facility], where she said she will be able to relax and feel renewed. Overall, she seems to be stable at this time, despite her complaints.  Plan to see her again in 3 weeks. Franne FortsKenny Eufemio Strahm, LCSW

## 2016-01-22 ENCOUNTER — Ambulatory Visit: Payer: Medicaid Other

## 2016-02-05 ENCOUNTER — Ambulatory Visit: Payer: Medicaid Other

## 2016-02-21 ENCOUNTER — Ambulatory Visit: Payer: Medicaid Other

## 2016-02-21 DIAGNOSIS — F331 Major depressive disorder, recurrent, moderate: Secondary | ICD-10-CM

## 2016-02-21 NOTE — BH Specialist Note (Signed)
Kim Butler was in a good mood today, reporting that she had a good visit with her mother and family in the [place], [place].  She attended a special ceremony while she was there, out on a beach in [place] that she attends every year that involves drumming and singing, which she really enjoys. She talked about how she has to stay to herself when she is in her neighborhood, to avoid negative people and I basically gave her permission to set boundaries with people who may drag her down. Plan to meet again in 2 weeks. [name], LCSW

## 2016-03-05 ENCOUNTER — Ambulatory Visit: Payer: Medicaid Other

## 2016-04-08 ENCOUNTER — Ambulatory Visit: Payer: Medicaid Other

## 2016-04-08 DIAGNOSIS — F330 Major depressive disorder, recurrent, mild: Secondary | ICD-10-CM

## 2016-04-08 NOTE — BH Specialist Note (Signed)
Kim Butler was in her usual animated self today, joking with staff and patients in the lobby prior to coming back to my office. She reports that she recently spent an entire month looking after her mother in [place], [place].  She said she was exhausted after that and I validated that care taking loved ones is hard work. She is thinking about moving her mother down to [place] so that she can be out of the city and be able to relax more. I also asked her to help out with the [employer] of the [employer] "Women's Empowerment" Day on Sept. 30, because they are having a drum circle and she has experience doing that.  She got very excited about that and plans to participate.  Plan to meet again in 3 weeks. Franne FortsKenny Rahmah Mccamy, LCSW

## 2016-04-09 ENCOUNTER — Ambulatory Visit (INDEPENDENT_AMBULATORY_CARE_PROVIDER_SITE_OTHER): Payer: Medicaid Other | Admitting: Internal Medicine

## 2016-04-09 ENCOUNTER — Encounter: Payer: Self-pay | Admitting: Internal Medicine

## 2016-04-09 VITALS — BP 110/78 | HR 80 | Temp 98.6°F | Wt 171.0 lb

## 2016-04-09 DIAGNOSIS — I208 Other forms of angina pectoris: Secondary | ICD-10-CM | POA: Diagnosis not present

## 2016-04-09 DIAGNOSIS — M170 Bilateral primary osteoarthritis of knee: Secondary | ICD-10-CM | POA: Diagnosis not present

## 2016-04-09 DIAGNOSIS — B20 Human immunodeficiency virus [HIV] disease: Secondary | ICD-10-CM | POA: Diagnosis not present

## 2016-04-09 LAB — LIPID PANEL
CHOL/HDL RATIO: 3.1 ratio (ref <=5.0)
CHOLESTEROL: 147 mg/dL (ref 125–200)
HDL: 47 mg/dL (ref >=46)
LDL Cholesterol: 75 mg/dL (ref <130)
Triglycerides: 123 mg/dL (ref <150)
VLDL: 25 mg/dL (ref <30)

## 2016-04-09 LAB — COMPLETE METABOLIC PANEL WITH GFR
ALK PHOS: 99 U/L (ref 33–130)
ALT: 13 U/L (ref 6–29)
AST: 25 U/L (ref 10–35)
Albumin: 4.2 g/dL (ref 3.6–5.1)
BILIRUBIN TOTAL: 0.3 mg/dL (ref 0.2–1.2)
BUN: 11 mg/dL (ref 7–25)
CALCIUM: 9.5 mg/dL (ref 8.6–10.4)
CO2: 26 mmol/L (ref 20–31)
CREATININE: 0.88 mg/dL (ref 0.50–1.05)
Chloride: 105 mmol/L (ref 98–110)
GFR, EST AFRICAN AMERICAN: 83 mL/min (ref >=60)
GFR, EST NON AFRICAN AMERICAN: 72 mL/min (ref >=60)
Glucose, Bld: 81 mg/dL (ref 65–99)
Potassium: 4 mmol/L (ref 3.5–5.3)
Sodium: 139 mmol/L (ref 135–146)
TOTAL PROTEIN: 8.3 g/dL — AB (ref 6.1–8.1)

## 2016-04-09 LAB — CBC WITH DIFFERENTIAL/PLATELET
BASOS ABS: 0 {cells}/uL (ref 0–200)
Basophils Relative: 0 %
EOS ABS: 174 {cells}/uL (ref 15–500)
Eosinophils Relative: 3 %
HEMATOCRIT: 39.8 % (ref 35.0–45.0)
Hemoglobin: 12.6 g/dL (ref 11.7–15.5)
LYMPHS PCT: 68 %
Lymphs Abs: 3944 cells/uL — ABNORMAL HIGH (ref 850–3900)
MCH: 28.3 pg (ref 27.0–33.0)
MCHC: 31.7 g/dL — ABNORMAL LOW (ref 32.0–36.0)
MCV: 89.2 fL (ref 80.0–100.0)
MONO ABS: 290 {cells}/uL (ref 200–950)
MONOS PCT: 5 %
MPV: 10.1 fL (ref 7.5–12.5)
NEUTROS ABS: 1392 {cells}/uL — AB (ref 1500–7800)
Neutrophils Relative %: 24 %
PLATELETS: 206 10*3/uL (ref 140–400)
RBC: 4.46 MIL/uL (ref 3.80–5.10)
RDW: 15 % (ref 11.0–15.0)
WBC: 5.8 10*3/uL (ref 3.8–10.8)

## 2016-04-09 NOTE — Patient Instructions (Signed)
Please start taking baby aspirin 81mg  daily, until you see cardiology   Cardiology clinic will call you within the next few days for an appt

## 2016-04-09 NOTE — Progress Notes (Signed)
RFV: follow up on HIV disease  Patient ID: Kim MadridJoycelyn Butler, female   DOB: 03/03/1956, 60 y.o.   MRN: [id number]  HPI Kim EssexJocelyn is 60yo F with hiv disease, CD 4 count of 570/VL 722, ART naive who has not been interested in starting ART, though comes to clinic consistently, as well as seeks counseling with Kim RedbirdKenny routinely. She is rastafarian, with holistic beliefs.  ROS: ongoing bilateral knee pain worse with ambulation, still in the morning but improves shortly after getting up in the morning  She mentions that she gets substernal chest pain that radiates to right side of neck and axilla with brisk walking. The chest pressure subsides when she stops activity. She does admit that she also notices chest pain when she gets aggravitated, heated argument. She is currently asymptomatic   Outpatient Encounter Prescriptions as of 04/09/2016  Medication Sig  . omeprazole (PRILOSEC) 20 MG capsule Take 1 capsule (20 mg total) by mouth daily.  Marland Kitchen. oxyCODONE (OXY IR/ROXICODONE) 5 MG immediate release tablet Take 1 tablet (5 mg total) by mouth 2 (two) times daily.   No facility-administered encounter medications on file as of 04/09/2016.      Patient Active Problem List   Diagnosis Date Noted  . Esophageal reflux 05/19/2013  . CL (cholelithiasis) 05/19/2013  . Special screening for malignant neoplasms, colon 05/19/2013  . HIV positive (HCC) 05/19/2013     Health Maintenance Due  Topic Date Due  . TETANUS/TDAP  05/30/1975  . MAMMOGRAM  05/29/2006  . COLONOSCOPY  05/29/2006  . INFLUENZA VACCINE  02/26/2016     Review of Systems Per hpi Physical Exam   BP 110/78   Pulse 80   Temp 98.6 F (37 C) (Oral)   Wt 171 lb (77.6 kg)   BMI 28.46 kg/m  Physical Exam  Constitutional:  oriented to person, place, and time. appears well-developed and well-nourished. No distress.  HENT: [place]/AT, PERRLA, no scleral icterus Mouth/Throat: Oropharynx is clear and moist. No oropharyngeal exudate.    Cardiovascular: Normal rate, regular rhythm and normal heart sounds. Exam reveals no gallop and no friction rub.  No murmur heard.  Pulmonary/Chest: Effort normal and breath sounds normal. No respiratory distress.  has no wheezes.  Neck = supple, no nuchal rigidity Abdominal: Soft. Bowel sounds are normal.  exhibits no distension. There is no tenderness.  Lymphadenopathy: no cervical adenopathy. No axillary adenopathy Neurological: alert and oriented to person, place, and time.  Skin: Skin is warm and dry. No rash noted. No erythema.  Psychiatric: a normal mood and affect.  behavior is normal.   Lab Results  Component Value Date   CD4TCELL 18 (L) 10/31/2015   Lab Results  Component Value Date   CD4TABS 570 10/31/2015   CD4TABS 520 05/08/2015   CD4TABS 600 02/19/2015   Lab Results  Component Value Date   HIV1RNAQUANT 722 (H) 10/31/2015   Lab Results  Component Value Date   HEPBSAB POS (A) 05/03/2013   No results found for: RPR  CBC Lab Results  Component Value Date   WBC 5.0 10/31/2015   RBC 4.33 10/31/2015   HGB 12.3 10/31/2015   HCT 38.8 10/31/2015   PLT 245 10/31/2015   MCV 89.6 10/31/2015   MCH 28.4 10/31/2015   MCHC 31.7 (L) 10/31/2015   RDW 15.2 (H) 10/31/2015   LYMPHSABS 2,950 10/31/2015   MONOABS 300 10/31/2015   EOSABS 50 10/31/2015   BASOSABS 0 10/31/2015   BMET Lab Results  Component Value Date   NA  143 10/31/2015   K 4.6 10/31/2015   CL 108 10/31/2015   CO2 28 10/31/2015   GLUCOSE 82 10/31/2015   BUN 10 10/31/2015   CREATININE 0.92 10/31/2015   CALCIUM 9.2 10/31/2015   GFRNONAA 68 10/31/2015   GFRAA 79 10/31/2015     Assessment and Plan: hiv disease - will check labs, will check lipids  Angina - called cards for her to get appt for stress test, cards eval - will start on low dose aspirin 81mg  daily, at least until she sees cardiology  - she deferred hiv treatment - she deferred getting flu vaccine  Osteoarthritis of knee = will get  xray of knee to see if she has significant degeneration to need referral to orthopedics

## 2016-04-10 LAB — HIV-1 RNA QUANT-NO REFLEX-BLD
HIV 1 RNA QUANT: 103 {copies}/mL — AB (ref <20)
HIV-1 RNA QUANT, LOG: 2.01 {Log_copies}/mL — AB (ref <1.30)

## 2016-04-10 LAB — T-HELPER CELL (CD4) - (RCID CLINIC ONLY)
CD4 % Helper T Cell: 18 % — ABNORMAL LOW (ref 33–55)
CD4 T Cell Abs: 700 /uL (ref 400–2700)

## 2016-04-28 ENCOUNTER — Encounter: Payer: Self-pay | Admitting: Cardiovascular Disease

## 2016-04-28 ENCOUNTER — Ambulatory Visit (INDEPENDENT_AMBULATORY_CARE_PROVIDER_SITE_OTHER): Payer: Medicaid Other | Admitting: Cardiovascular Disease

## 2016-04-28 VITALS — BP 126/78 | HR 67 | Ht 65.0 in | Wt 173.4 lb

## 2016-04-28 DIAGNOSIS — I208 Other forms of angina pectoris: Secondary | ICD-10-CM

## 2016-04-28 DIAGNOSIS — Z21 Asymptomatic human immunodeficiency virus [HIV] infection status: Secondary | ICD-10-CM

## 2016-04-28 DIAGNOSIS — Z720 Tobacco use: Secondary | ICD-10-CM | POA: Diagnosis not present

## 2016-04-28 NOTE — Patient Instructions (Addendum)
Medication Instructions: Dr Royann Shiversroitoru recommends that you continue on your current medications as directed. Please refer to the Current Medication list given to you today.  Labwork: NONE ORDERED  Testing/Procedures: 1. Exercise Myoview Stress test - Your physician has requested that you have en exercise stress myoview. For further information please visit [email or web address]. Please follow instruction sheet, as given.  Follow-up: Dr Royann Shiversroitoru recommends that you schedule a follow-up appointment after stress test.  If you need a refill on your cardiac medications before your next appointment, please call your pharmacy.

## 2016-04-28 NOTE — Progress Notes (Signed)
Cardiology consultation Note    Date:  04/28/2016   ID:  Mickelle Goupil, DOB [birth year], MRN [id number]  PCP:  Jackie Plum, MD  Cardiologist:   Thurmon Fair, MD  Referred in consultation by: Judyann Munson, M.D. Reason for consultation: Exertional angina pectoris  Chief Complaint  Patient presents with  . New Patient (Initial Visit)    sob; when walking. chest tightness.    History of Present Illness:  Kim Butler is a 60 y.o. female without known cardiac or vascular disease referred in consultation for exertional angina pectoris. She describes a fairly convincing pattern of discomfort in her lower left chest that occurs when she is walking fast and is alleviated several minutes after stopping. It is associated with shortness of breath. She does her household chores spread out over a period of 4-5 days to avoid having these problems. She has never had chest discomfort at rest or related to meals. She does take omeprazole at least since the year ago.  She does not have diabetes mellitus or hypertension and has a low risk lipid profile. She has smoked since the age of 37. She is HIV positive but is a long-term nonprogressor and so far has declined anti-retroviral therapy. She is Rastafarian, originally from [place], but spends a large part of her life in the [place] where her mother and siblings still live.  She denies palpitations, syncope, leg edema, intermittent claudication, focal neurological deficits. She has chronic sciatica and knee pain.  Past Medical History:  Diagnosis Date  . Anxiety   . Asthma   . Blood transfusion without reported diagnosis 1975  . Cholelithiasis   . Depression   . HIV (human immunodeficiency virus infection) (HCC)   . Sciatic nerve pain   . Tobacco dependence     Past Surgical History:  Procedure Laterality Date  . ethopic pregnancy     1979    Current Medications: Outpatient Medications Prior to Visit  Medication Sig Dispense  Refill  . omeprazole (PRILOSEC) 20 MG capsule Take 1 capsule (20 mg total) by mouth daily. 30 capsule 11  . oxyCODONE (OXY IR/ROXICODONE) 5 MG immediate release tablet Take 1 tablet (5 mg total) by mouth 2 (two) times daily. 60 tablet 0   No facility-administered medications prior to visit.      Allergies:   Penicillins   Social History   Social History  . Marital status: Single    Spouse name: N/A  . Number of children: N/A  . Years of education: N/A   Social History Main Topics  . Smoking status: Current Every Day Smoker    Packs/day: 0.40    Types: Cigarettes  . Smokeless tobacco: Never Used  . Alcohol use No  . Drug use:     Frequency: 4.0 times per week    Types: Marijuana  . Sexual activity: Not Currently    Partners: Male     Comment: 2010 last sexual encounter    Other Topics Concern  . None   Social History Narrative  . None     Family History:  The patient's family history includes Alzheimer's disease in her father; Glaucoma in her mother; Hypertension in her father.   ROS:   Please see the history of present illness.    ROS All other systems reviewed and are negative.   PHYSICAL EXAM:   VS:  BP 126/78   Pulse 67   Ht 5\' 5"  (1.651 m)   Wt 78.7 kg (173 lb 6.4 oz)  BMI 28.86 kg/m    GEN: Well nourished, well developed, in no acute distress  HEENT: normal  Neck: no JVD, carotid bruits, or masses Cardiac: RRR; no murmurs, rubs, or gallops,no edema  Respiratory:  clear to auscultation bilaterally, normal work of breathing GI: soft, nontender, nondistended, + BS MS: no deformity or atrophy  Skin: warm and dry, no rash Neuro:  Alert and Oriented x 3, Strength and sensation are intact Psych: euthymic mood, full affect  Wt Readings from Last 3 Encounters:  04/28/16 78.7 kg (173 lb 6.4 oz)  04/09/16 77.6 kg (171 lb)  10/31/15 78.5 kg (173 lb)      Studies/Labs Reviewed:   EKG:  EKG is ordered today.  The ekg ordered today demonstrates Normal  sinus rhythm, normal tracing  Recent Labs: 04/09/2016: ALT 13; BUN 11; Creat 0.88; Hemoglobin 12.6; Platelets 206; Potassium 4.0; Sodium 139   Lipid Panel    Component Value Date/Time   CHOL 147 04/09/2016 1431   TRIG 123 04/09/2016 1431   HDL 47 04/09/2016 1431   CHOLHDL 3.1 04/09/2016 1431   VLDL 25 04/09/2016 1431   LDLCALC 75 04/09/2016 1431    Additional studies/ records that were reviewed today include:  Notes from Dr. Drue SecondSnider   ASSESSMENT:    1. Exertional angina (HCC)   2. Tobacco abuse   3. HIV positive (HCC)      PLAN:  In order of problems listed above:  1. Her chest pain syndrome is very convincing for exertional angina pectoris, but her risk factor profile is limited: primarily smoking, with low risk gender/age/lipids/family history. Resting ECG and normal exam are reassuring. She should undergo a functional study. Unfortunately her knee pain and sciatica may prevent her from exercising on a treadmill. We'll try to get a treadmill stress test but may have to settle for pharmacological Myoview. 2. Strongly encouraged to quit smoking. She thinks she will not be able to quit. We reviewed some methods to achieve this, but she made no commitment. 3. Long-term nonprogressor, not on ART.    Medication Adjustments/Labs and Tests Ordered: Current medicines are reviewed at length with the patient today.  Concerns regarding medicines are outlined above.  Medication changes, Labs and Tests ordered today are listed in the Patient Instructions below. Patient Instructions  Medication Instructions: Dr Royann Shiversroitoru recommends that you continue on your current medications as directed. Please refer to the Current Medication list given to you today.  Labwork: NONE ORDERED  Testing/Procedures: 1. Exercise Myoview Stress test - Your physician has requested that you have en exercise stress myoview. For further information please visit [email or web address]. Please follow instruction  sheet, as given.  Follow-up: Dr Royann Shiversroitoru recommends that you schedule a follow-up appointment after stress test.  If you need a refill on your cardiac medications before your next appointment, please call your pharmacy.    Signed, Thurmon FairMihai Bellanie Matthew, MD  04/28/2016 5:43 PM    [facility] [address], [place], [place]  [address] Phone: [phone]; Fax: [phone]

## 2016-04-29 ENCOUNTER — Ambulatory Visit: Payer: Medicaid Other

## 2016-04-29 DIAGNOSIS — F331 Major depressive disorder, recurrent, moderate: Secondary | ICD-10-CM

## 2016-04-29 NOTE — BH Specialist Note (Signed)
Vaani was in a pleasant mood for the most part today, but talked some about the recent tragedy in [place]. She talked again about how she is looking into bringing her elderly mother from [place] to live here with her, if she would agree and if the siblings would agree. She said she was looking forward to cooking some when she gets home today. No major problems today.  Plan to meet again in 3 weeks. Franne FortsKenny Angelino Rumery, LCSW

## 2016-05-07 ENCOUNTER — Telehealth (HOSPITAL_COMMUNITY): Payer: Self-pay

## 2016-05-07 NOTE — Telephone Encounter (Signed)
Encounter complete. 

## 2016-05-09 ENCOUNTER — Ambulatory Visit (HOSPITAL_COMMUNITY)
Admission: RE | Admit: 2016-05-09 | Discharge: 2016-05-09 | Disposition: A | Discharge status: Home / Self Care | Payer: Medicaid Other | Source: Ambulatory Visit | Attending: Cardiology | Admitting: Cardiology

## 2016-05-09 DIAGNOSIS — I208 Other forms of angina pectoris: Secondary | ICD-10-CM | POA: Diagnosis not present

## 2016-05-09 LAB — MYOCARDIAL PERFUSION IMAGING
CHL CUP MPHR: 161 {beats}/min
CHL RATE OF PERCEIVED EXERTION: 18
CSEPED: 3 min
CSEPEDS: 36 s
Estimated workload: 5.3 METS
LV dias vol: 59 mL (ref 46–106)
LVSYSVOL: 18 mL
NUC STRESS TID: 1.33
Peak HR: 150 {beats}/min
Percent HR: 93 %
Rest HR: 71 {beats}/min
SDS: 1
SRS: 0
SSS: 1

## 2016-05-09 MED ORDER — TECHNETIUM TC 99M TETROFOSMIN IV KIT
10.3000 | PACK | Freq: Once | INTRAVENOUS | Status: AC | PRN
  Administered 2016-05-09: 10.3 via INTRAVENOUS
  Filled 2016-05-09: qty 11

## 2016-05-09 MED ORDER — TECHNETIUM TC 99M TETROFOSMIN IV KIT
31.0000 | PACK | Freq: Once | INTRAVENOUS | Status: AC | PRN
  Administered 2016-05-09: 31 via INTRAVENOUS
  Filled 2016-05-09: qty 31

## 2016-05-14 ENCOUNTER — Ambulatory Visit: Payer: Medicaid Other

## 2016-05-28 ENCOUNTER — Ambulatory Visit: Payer: Medicaid Other

## 2016-05-28 DIAGNOSIS — F331 Major depressive disorder, recurrent, moderate: Secondary | ICD-10-CM

## 2016-05-28 NOTE — BH Specialist Note (Signed)
Kim Butler was in a good mood today but then told me about going to [name] to visit her father, who is 60 years old and in a hospital there. She stayed with a woman who is distantly related and she didn't get along with her. She is glad she went because she said she told him she loved him despite their estrangement. Overall, she seems to be doing okay.  Plan to meet in 3 weeks. Kim FortsKenny Ambriella Kitt, LCSW

## 2016-06-03 DIAGNOSIS — F17200 Nicotine dependence, unspecified, uncomplicated: Secondary | ICD-10-CM | POA: Insufficient documentation

## 2016-06-03 DIAGNOSIS — R079 Chest pain, unspecified: Secondary | ICD-10-CM | POA: Insufficient documentation

## 2016-06-03 NOTE — Progress Notes (Signed)
Cardiology consultation Note    Date:  06/04/2016   ID:  Kim Butler, DOB 01/10/1956, MRN [id number]  PCP:  Jackie PlumSEI-BONSU,GEORGE, MD  Cardiologist:   Thurmon FairMihai Kellyann Ordway, MD  Referred in consultation by: Judyann Munsonynthia Snider, M.D. Reason for consultation: Exertional angina pectoris  Chief Complaint  Patient presents with  . Follow-up    F/U AFTER TESTS     History of Present Illness:  Kim MadridJoycelyn Degen is a 60 y.o. female with Exertional chest pain, but without significant coronary risk factors returning in follow-up after undergoing a nuclear stress test. The study was normal. Both the ECG and the perfusion images were negative for ischemia. Her blood pressure was normal in response to exercise, but her exercise capacity was rather poor (under 4 minutes) with an exaggerated heart rate response suggestive of deconditioning. The study was stopped due to both fatigue and chest discomfort.  She does not have diabetes mellitus or hypertension and has a low risk lipid profile. She has smoked since the age of 89. She is HIV positive but is a long-term nonprogressor and so far has declined anti-retroviral therapy. She is Rastafarian, originally from [place], but spends a large part of her life in the [place] where her mother and siblings still live.  She denies palpitations, syncope, leg edema, intermittent claudication, focal neurological deficits. She has chronic sciatica and knee pain.  Past Medical History:  Diagnosis Date  . Anxiety   . Asthma   . Blood transfusion without reported diagnosis 1975  . Cholelithiasis   . Depression   . HIV (human immunodeficiency virus infection) (HCC)   . Sciatic nerve pain   . Tobacco dependence     Past Surgical History:  Procedure Laterality Date  . ethopic pregnancy     1979    Current Medications: Outpatient Medications Prior to Visit  Medication Sig Dispense Refill  . omeprazole (PRILOSEC) 20 MG capsule Take 1 capsule (20 mg total) by mouth daily.  30 capsule 11  . oxyCODONE (OXY IR/ROXICODONE) 5 MG immediate release tablet Take 1 tablet (5 mg total) by mouth 2 (two) times daily. 60 tablet 0   No facility-administered medications prior to visit.      Allergies:   Penicillins   Social History   Social History  . Marital status: Single    Spouse name: N/A  . Number of children: N/A  . Years of education: N/A   Social History Main Topics  . Smoking status: Current Every Day Smoker    Packs/day: 0.40    Types: Cigarettes  . Smokeless tobacco: Never Used  . Alcohol use No  . Drug use:     Frequency: 4.0 times per week    Types: Marijuana  . Sexual activity: Not Currently    Partners: Male     Comment: 2010 last sexual encounter    Other Topics Concern  . None   Social History Narrative  . None     Family History:  The patient's family history includes Alzheimer's disease in her father; Glaucoma in her mother; Hypertension in her father.   ROS:   Please see the history of present illness.    ROS All other systems reviewed and are negative.   PHYSICAL EXAM:   VS:  BP 135/80 (BP Location: Right Arm, Patient Position: Sitting, Cuff Size: Normal)   Pulse 93   Ht 5\' 5"  (1.651 m)   Wt 172 lb (78 kg)   SpO2 98%   BMI 28.62 kg/m  GEN: Well nourished, well developed, in no acute distress  HEENT: normal  Neck: no JVD, carotid bruits, or masses Cardiac: RRR; no murmurs, rubs, or gallops,no edema  Respiratory:  clear to auscultation bilaterally, normal work of breathing GI: soft, nontender, nondistended, + BS MS: no deformity or atrophy  Skin: warm and dry, no rash Neuro:  Alert and Oriented x 3, Strength and sensation are intact Psych: euthymic mood, full affect  Wt Readings from Last 3 Encounters:  06/04/16 172 lb (78 kg)  05/09/16 173 lb (78.5 kg)  04/28/16 173 lb 6.4 oz (78.7 kg)      Studies/Labs Reviewed:   EKG:  EKG is ordered today.  The ekg ordered today demonstrates Normal sinus rhythm, normal  tracing  Recent Labs: 04/09/2016: ALT 13; BUN 11; Creat 0.88; Hemoglobin 12.6; Platelets 206; Potassium 4.0; Sodium 139   Lipid Panel    Component Value Date/Time   CHOL 147 04/09/2016 1431   TRIG 123 04/09/2016 1431   HDL 47 04/09/2016 1431   CHOLHDL 3.1 04/09/2016 1431   VLDL 25 04/09/2016 1431   LDLCALC 75 04/09/2016 1431      ASSESSMENT:    1. Exertional chest pain with normal nuclear stress test   2. Smoker   3. HIV positive (HCC)      PLAN:  In order of problems listed above:  1. Her chest pain syndrome is very convincing for exertional angina pectoris, but her risk factor profile is limited and her nuclear stress test was normal. We'll try to see if empirical therapy with antianginals provides any relief of her symptoms. Try low-dose beta blocker. 2. Strongly encouraged to quit smoking. She thinks she will not be able to quit. We reviewed some methods to achieve this, but she made no commitment. 3. Long-term nonprogressor, not on ART.    Medication Adjustments/Labs and Tests Ordered: Current medicines are reviewed at length with the patient today.  Concerns regarding medicines are outlined above.  Medication changes, Labs and Tests ordered today are listed in the Patient Instructions below. Patient Instructions  Dr Royann Shiversroitoru has recommended making the following medication changes: 1. START Metoprolol Succinate 25 mg - take 1 tablet by mouth daily  Your physician recommends that you schedule a follow-up appointment in 3 months with Dr Royann Shiversroitoru.  If you need a refill on your cardiac medications before your next appointment, please call your pharmacy.    Signed, Thurmon FairMihai Maribel Luis, MD  06/04/2016 8:51 AM    [facility] [address], [place], [place]  [address] Phone: [phone]; Fax: [phone]

## 2016-06-04 ENCOUNTER — Ambulatory Visit (INDEPENDENT_AMBULATORY_CARE_PROVIDER_SITE_OTHER): Payer: Medicaid Other | Admitting: Cardiovascular Disease

## 2016-06-04 ENCOUNTER — Encounter: Payer: Self-pay | Admitting: Cardiovascular Disease

## 2016-06-04 VITALS — BP 135/80 | HR 93 | Ht 65.0 in | Wt 172.0 lb

## 2016-06-04 DIAGNOSIS — Z21 Asymptomatic human immunodeficiency virus [HIV] infection status: Secondary | ICD-10-CM | POA: Diagnosis not present

## 2016-06-04 DIAGNOSIS — R079 Chest pain, unspecified: Secondary | ICD-10-CM

## 2016-06-04 DIAGNOSIS — F17200 Nicotine dependence, unspecified, uncomplicated: Secondary | ICD-10-CM

## 2016-06-04 MED ORDER — METOPROLOL SUCCINATE ER 25 MG PO TB24: 25.0000 mg | ORAL_TABLET | Freq: Every day | ORAL | 11 refills | Status: AC

## 2016-06-04 NOTE — Patient Instructions (Signed)
Dr Croitoru has recommended making the following medication changes: 1. START Metoprolol Succinate 25 mg - take 1 tablet by mouth daily  Your physician recommends that you schedule a follow-up appointment in 3 months with Dr Croitoru.  If you need a refill on your cardiac medications before your next appointment, please call your pharmacy. 

## 2016-06-09 ENCOUNTER — Other Ambulatory Visit: Payer: Self-pay | Admitting: Internal Medicine

## 2016-06-09 DIAGNOSIS — K219 Gastro-esophageal reflux disease without esophagitis: Secondary | ICD-10-CM

## 2016-06-18 ENCOUNTER — Ambulatory Visit: Payer: Medicaid Other

## 2016-06-18 DIAGNOSIS — F331 Major depressive disorder, recurrent, moderate: Secondary | ICD-10-CM

## 2016-06-18 NOTE — BH Specialist Note (Signed)
Kim Butler reported that she is going up to [place] to spend time with her mother and is moving out of her apartment here in [place] because of some issues with the management. [place] will go [place] to see her father, who is recuperating after a hospitalization. Overall, her mood remains good.  Plan to meet again in December. Franne FortsKenny Genora Arp, LCSW

## 2016-07-08 ENCOUNTER — Ambulatory Visit (INDEPENDENT_AMBULATORY_CARE_PROVIDER_SITE_OTHER): Payer: Medicaid Other

## 2016-07-08 DIAGNOSIS — F330 Major depressive disorder, recurrent, mild: Secondary | ICD-10-CM | POA: Diagnosis present

## 2016-07-08 NOTE — BH Specialist Note (Signed)
Kim Butler was in a good mood again today, laughing with staff when I met her in the lobby. She reports that she is leaving for [place] next week to spend some time with her mother. She has been complaining of her apartment here so she is not renewing her lease and is putting things in storage and plans to return to [place] around February.  Will see her when/if she reschedules then. Curley Spice, LCSW

## 2016-07-15 ENCOUNTER — Ambulatory Visit (INDEPENDENT_AMBULATORY_CARE_PROVIDER_SITE_OTHER): Payer: Medicaid Other | Admitting: Internal Medicine

## 2016-07-15 ENCOUNTER — Encounter: Payer: Self-pay | Admitting: Internal Medicine

## 2016-07-15 VITALS — BP 132/81 | HR 99 | Temp 98.0°F | Ht 64.0 in | Wt 176.0 lb

## 2016-07-15 DIAGNOSIS — K219 Gastro-esophageal reflux disease without esophagitis: Secondary | ICD-10-CM

## 2016-07-15 DIAGNOSIS — M5440 Lumbago with sciatica, unspecified side: Secondary | ICD-10-CM | POA: Diagnosis not present

## 2016-07-15 DIAGNOSIS — B20 Human immunodeficiency virus [HIV] disease: Secondary | ICD-10-CM | POA: Diagnosis present

## 2016-07-15 MED ORDER — OMEPRAZOLE 20 MG PO CPDR: 20.0000 mg | DELAYED_RELEASE_CAPSULE | Freq: Every day | ORAL | 11 refills | Status: AC

## 2016-07-15 MED ORDER — OXYCODONE HCL 5 MG PO TABS: 5.0000 mg | ORAL_TABLET | Freq: Two times a day (BID) | ORAL | 0 refills | Status: AC

## 2016-07-15 MED ORDER — ELVITEG-COBIC-EMTRICIT-TENOFAF 150-150-200-10 MG PO TABS: 1.0000 | ORAL_TABLET | Freq: Every day | ORAL | 11 refills | Status: AC

## 2016-07-15 NOTE — Progress Notes (Signed)
Rfv: follow up on hiv disease  Patient ID: Kim Butler, female   DOB: 11/20/1955, 60 y.o.   MRN: [id number]  HPI 60yo F who is a long term nonprogressor/elite controller with hiv disease, CD 4 count of 700/VL 103. ART naive who is finally interested in starting hiv treatment. She does routinely follow up. She reports that she still has chronic low back pain. Occasionally still using opiates as needed.  She is also "moving out of her crazy apt complex"  Outpatient Encounter Prescriptions as of 07/15/2016  Medication Sig  . metoprolol succinate (TOPROL-XL) 25 MG 24 hr tablet Take 1 tablet (25 mg total) by mouth daily.  Marland Kitchen. omeprazole (PRILOSEC) 20 MG capsule TAKE ONE CAPSULE BY MOUTH DAILY  . oxyCODONE (OXY IR/ROXICODONE) 5 MG immediate release tablet Take 1 tablet (5 mg total) by mouth 2 (two) times daily.   No facility-administered encounter medications on file as of 07/15/2016.      Patient Active Problem List   Diagnosis Date Noted  . Exertional chest pain with normal nuclear stress test 06/03/2016  . Smoker 06/03/2016  . Esophageal reflux 05/19/2013  . CL (cholelithiasis) 05/19/2013  . Special screening for malignant neoplasms, colon 05/19/2013  . HIV positive (HCC) 05/19/2013     Health Maintenance Due  Topic Date Due  . TETANUS/TDAP  05/30/1975  . MAMMOGRAM  05/29/2006  . COLONOSCOPY  05/29/2006  . INFLUENZA VACCINE  02/26/2016  . ZOSTAVAX  05/29/2016     Review of Systems Review of Systems  Constitutional: Negative for fever, chills, diaphoresis, activity change, appetite change, fatigue and unexpected weight change.  HENT: Negative for congestion, sore throat, rhinorrhea, sneezing, trouble swallowing and sinus pressure.  Eyes: Negative for photophobia and visual disturbance.  Respiratory: Negative for cough, chest tightness, shortness of breath, wheezing and stridor.  Cardiovascular: Negative for chest pain, palpitations and leg swelling.  Gastrointestinal:  Negative for nausea, vomiting, abdominal pain, diarrhea, constipation, blood in stool, abdominal distention and anal bleeding.  Genitourinary: Negative for dysuria, hematuria, flank pain and difficulty urinating.  Musculoskeletal: back pain + Skin: Negative for color change, pallor, rash and wound.  Neurological: Negative for dizziness, tremors, weakness and light-headedness.  Hematological: Negative for adenopathy. Does not bruise/bleed easily.  Psychiatric/Behavioral: Negative for behavioral problems, confusion, sleep disturbance, dysphoric mood, decreased concentration and agitation.    Physical Exam   BP 132/81   Pulse 99   Temp 98 F (36.7 C) (Oral)   Ht 5\' 4"  (1.626 m)   Wt 176 lb (79.8 kg)   BMI 30.21 kg/m  Physical Exam  Constitutional:  oriented to person, place, and time. appears well-developed and well-nourished. No distress.  HENT: [place]/AT, PERRLA, no scleral icterus Mouth/Throat: Oropharynx is clear and moist. No oropharyngeal exudate.  Cardiovascular: Normal rate, regular rhythm and normal heart sounds. Exam reveals no gallop and no friction rub.  No murmur heard.  Pulmonary/Chest: Effort normal and breath sounds normal. No respiratory distress.  has no wheezes.  Neck = supple, no nuchal rigidity Abdominal: Soft. Bowel sounds are normal.  exhibits no distension. There is no tenderness.  Lymphadenopathy: no cervical adenopathy. No axillary adenopathy Neurological: alert and oriented to person, place, and time.  Skin: Skin is warm and dry. No rash noted. No erythema.  Psychiatric: a normal mood and affect.  behavior is normal.   Lab Results  Component Value Date   CD4TCELL 18 (L) 04/09/2016   Lab Results  Component Value Date   CD4TABS 700 04/09/2016  CD4TABS 570 10/31/2015   CD4TABS 520 05/08/2015   Lab Results  Component Value Date   HIV1RNAQUANT 103 (H) 04/09/2016   Lab Results  Component Value Date   HEPBSAB POS (A) 05/03/2013   No results found for:  RPR  CBC Lab Results  Component Value Date   WBC 5.8 04/09/2016   RBC 4.46 04/09/2016   HGB 12.6 04/09/2016   HCT 39.8 04/09/2016   PLT 206 04/09/2016   MCV 89.2 04/09/2016   MCH 28.3 04/09/2016   MCHC 31.7 (L) 04/09/2016   RDW 15.0 04/09/2016   LYMPHSABS 3,944 (H) 04/09/2016   MONOABS 290 04/09/2016   EOSABS 174 04/09/2016   BASOSABS 0 04/09/2016   BMET Lab Results  Component Value Date   NA 139 04/09/2016   K 4.0 04/09/2016   CL 105 04/09/2016   CO2 26 04/09/2016   GLUCOSE 81 04/09/2016   BUN 11 04/09/2016   CREATININE 0.88 04/09/2016   CALCIUM 9.5 04/09/2016   GFRNONAA 72 04/09/2016   GFRAA 83 04/09/2016     Assessment and Plan  She deferred getting flu and meningococcal vaccine  hiv disease = she is ready for treatment! We will have her start on genvoya. We will get [id number] testing   Osteoarthritis = will give her refill on pain meds, last prescribed in april  gerd = will refill her prilosec

## 2016-07-15 NOTE — Patient Instructions (Signed)
Come in 2 wk prior to visit to get labs

## 2016-07-16 ENCOUNTER — Telehealth: Payer: Self-pay | Admitting: *Deleted

## 2016-07-16 DIAGNOSIS — G8929 Other chronic pain: Secondary | ICD-10-CM

## 2016-07-16 NOTE — Telephone Encounter (Signed)
Patient can only fill 14 days worth of her medication due to restrictions. She will need a pain management referral per verbal order from Dr Drue SecondSnider.  Patient is insured - medicaid. Order placed in referral work queue per verbal order. Andree CossHowell, Karas Pickerill M, RN

## 2016-07-26 LAB — HLA B*5701: HLA-B*5701 w/rflx HLA-B High: NEGATIVE

## 2016-08-08 ENCOUNTER — Ambulatory Visit: Payer: Medicaid Other

## 2016-09-05 ENCOUNTER — Ambulatory Visit: Payer: Medicaid Other | Admitting: Cardiovascular Disease

## 2018-06-05 IMAGING — NM NM MISC PROCEDURE
6 series · 36 of 36 positions shown · non-contrast
Comparison: none

[Series 1: wbr_r-proj_st wbr rest · 6.40mm/px · 6 of 64 frames shown]
[frame 6/64]
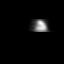
[frame 16/64]
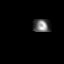
[frame 27/64]
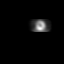
[frame 38/64]
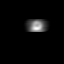
[frame 48/64]
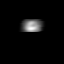
[frame 59/64]
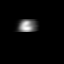

[Series 1: wbr rest · 6.40mm/px · 6 of 64 frames shown]
[frame 6/64]
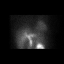
[frame 16/64]
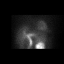
[frame 27/64]
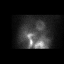
[frame 38/64]
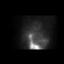
[frame 48/64]
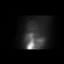
[frame 59/64]
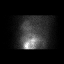

[Series 2: wbr stress-gsp · 6.40mm/px · 6 of 511 frames shown]
[frame 43/511  full-range]
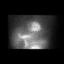
[frame 128/511  full-range]
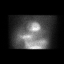
[frame 213/511  full-range]
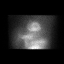
[frame 298/511  full-range]
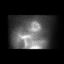
[frame 383/511  full-range]
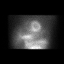
[frame 469/511  full-range]
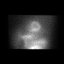

[Series 2: wbr_s-proj_st wbr stress-gsp · 6.40mm/px · 6 of 512 frames shown]
[frame 43/512]
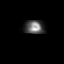
[frame 128/512]
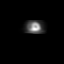
[frame 214/512]
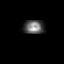
[frame 299/512]
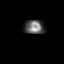
[frame 384/512]
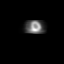
[frame 470/512]
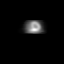

[Series 3: wbr_s-proj_st wbr stress-sum-em · 6.40mm/px · 6 of 64 frames shown]
[frame 6/64]
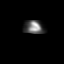
[frame 16/64]
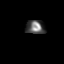
[frame 27/64]
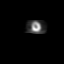
[frame 38/64]
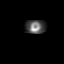
[frame 48/64]
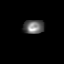
[frame 59/64]
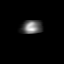

[Series 3: wbr stress-sum-em · 6.40mm/px · 6 of 64 frames shown]
[frame 6/64]
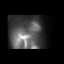
[frame 16/64]
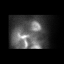
[frame 27/64]
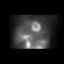
[frame 38/64]
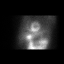
[frame 48/64]
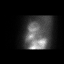
[frame 59/64]
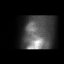

[36 of 36 positions shown; findings below may reference images not displayed]

Canned report from images found in remote index.

Refer to host system for actual result text.
